# Patient Record
Sex: Female | Born: 1935 | Race: White | Hispanic: No | Smoking: Former smoker
Health system: Southern US, Community
[De-identification: ages and names within clinical notes are randomized; demographics above are authoritative.]

## PROBLEM LIST (undated history)

## (undated) DIAGNOSIS — I519 Heart disease, unspecified: Secondary | ICD-10-CM

## (undated) DIAGNOSIS — T7840XA Allergy, unspecified, initial encounter: Secondary | ICD-10-CM

## (undated) DIAGNOSIS — I219 Acute myocardial infarction, unspecified: Secondary | ICD-10-CM

## (undated) DIAGNOSIS — I4891 Unspecified atrial fibrillation: Secondary | ICD-10-CM

## (undated) DIAGNOSIS — I639 Cerebral infarction, unspecified: Secondary | ICD-10-CM

## (undated) DIAGNOSIS — Z955 Presence of coronary angioplasty implant and graft: Secondary | ICD-10-CM

## (undated) DIAGNOSIS — I10 Essential (primary) hypertension: Secondary | ICD-10-CM

## (undated) DIAGNOSIS — Z8619 Personal history of other infectious and parasitic diseases: Secondary | ICD-10-CM

## (undated) HISTORY — DX: Allergy, unspecified, initial encounter: T78.40XA

## (undated) HISTORY — DX: Essential (primary) hypertension: I10

## (undated) HISTORY — DX: Personal history of other infectious and parasitic diseases: Z86.19

## (undated) HISTORY — DX: Cerebral infarction, unspecified: I63.9

## (undated) HISTORY — DX: Heart disease, unspecified: I51.9

---

## 2012-12-25 ENCOUNTER — Ambulatory Visit (INDEPENDENT_AMBULATORY_CARE_PROVIDER_SITE_OTHER): Payer: Medicare Other | Admitting: Internal Medicine

## 2012-12-25 ENCOUNTER — Encounter: Payer: Self-pay | Admitting: Internal Medicine

## 2012-12-25 VITALS — BP 130/80 | HR 66 | Temp 97.6°F | Ht 64.0 in | Wt 119.0 lb

## 2012-12-25 DIAGNOSIS — I25810 Atherosclerosis of coronary artery bypass graft(s) without angina pectoris: Secondary | ICD-10-CM | POA: Insufficient documentation

## 2012-12-25 DIAGNOSIS — D490 Neoplasm of unspecified behavior of digestive system: Secondary | ICD-10-CM

## 2012-12-25 DIAGNOSIS — I10 Essential (primary) hypertension: Secondary | ICD-10-CM

## 2012-12-25 DIAGNOSIS — K219 Gastro-esophageal reflux disease without esophagitis: Secondary | ICD-10-CM

## 2012-12-25 NOTE — Progress Notes (Signed)
HPI  Pt presents to the clinic today to establish care. She is transferring care from [facility] in [place]. She has no concerns today.  Flu: 2013 Tetanus: unknown Pneumovax: unknown Eye doctor: yearly Dentist: yearly Colonoscopy: 2009  Past Medical History  Diagnosis Date  . History of chicken pox   . Allergy   . Heart disease   . Hypertension   . Stroke     Current Outpatient Prescriptions  Medication Sig Dispense Refill  . aspirin 325 MG EC tablet Take 325 mg by mouth daily.      . clopidogrel (PLAVIX) 75 MG tablet Take 75 mg by mouth daily.      Marland Kitchen gabapentin (NEURONTIN) 100 MG capsule Take 100 mg by mouth 2 (two) times daily.      . lansoprazole (PREVACID) 30 MG capsule Take 30 mg by mouth daily.      . metoprolol (LOPRESSOR) 100 MG tablet Take 100 mg by mouth daily.      . nitroGLYCERIN (NITROSTAT) 0.4 MG SL tablet Place 0.4 mg under the tongue every 5 (five) minutes as needed for chest pain.       No current facility-administered medications for this visit.    Allergies  Allergen Reactions  . Penicillins   . Sulfa Antibiotics     Family History  Problem Relation Age of Onset  . Heart disease Other     Parent, Grandparent  . Hypertension Other     Parent, Grandparent    History   Social History  . Marital Status: Widowed    Spouse Name: N/A    Number of Children: N/A  . Years of Education: 12   Occupational History  . Retired    Social History Main Topics  . Smoking status: Former Games developer  . Smokeless tobacco: Never Used  . Alcohol Use: No  . Drug Use: No  . Sexually Active: Not on file   Other Topics Concern  . Not on file   Social History Narrative   Regular exercise-yes   Caffeine Use-yes    ROS:  Constitutional: Denies fever, malaise, fatigue, headache or abrupt weight changes.  HEENT: Denies eye pain, eye redness, ear pain, ringing in the ears, wax buildup, runny nose, nasal congestion, bloody nose, or sore throat. Respiratory:  Denies difficulty breathing, shortness of breath, cough or sputum production.   Cardiovascular: Denies chest pain, chest tightness, palpitations or swelling in the hands or feet.  Gastrointestinal: Denies abdominal pain, bloating, constipation, diarrhea or blood in the stool.  GU: Denies frequency, urgency, pain with urination, blood in urine, odor or discharge. Musculoskeletal: Denies decrease in range of motion, difficulty with gait, muscle pain or joint pain and swelling.  Skin: Denies redness, rashes, lesions or ulcercations.  Neurological: Denies dizziness, difficulty with memory, difficulty with speech or problems with balance and coordination.   No other specific complaints in a complete review of systems (except as listed in HPI above).  PE:  BP 130/80  Pulse 66  Temp(Src) 97.6 F (36.4 C) (Oral)  Ht 5\' 4"  (1.626 m)  Wt 119 lb (53.978 kg)  BMI 20.42 kg/m2  SpO2 96% Wt Readings from Last 3 Encounters:  12/25/12 119 lb (53.978 kg)    General: Appears her stated age, well developed, well nourished in NAD. HEENT: Head: normal shape and size; Eyes: sclera white, no icterus, conjunctiva pink, PERRLA and EOMs intact; Ears: Tm's gray and intact, normal light reflex; Nose: mucosa pink and moist, septum midline; Throat/Mouth: Teeth present, mucosa  pink and moist, no lesions or ulcerations noted.  Neck: Normal range of motion. Neck supple, trachea midline. No massses, lumps or thyromegaly present.  Cardiovascular: Normal rate and rhythm. S1,S2 noted.  No murmur, rubs or gallops noted. No JVD or BLE edema. No carotid bruits noted. Pulmonary/Chest: Normal effort and positive vesicular breath sounds. No respiratory distress. No wheezes, rales or ronchi noted.  Abdomen: Soft and nontender. Normal bowel sounds, no bruits noted. No distention or masses noted. Liver, spleen and kidneys non palpable. Musculoskeletal: Normal range of motion. No signs of joint swelling. No difficulty with gait.   Neurological: Alert and oriented. Cranial nerves II-XII intact. Coordination normal. +DTRs bilaterally. Psychiatric: Mood and affect normal. Behavior is normal. Judgment and thought content normal.      Assessment and Plan:  Preventative Health Maintenance:  Will get records from [facility] to see if HM is utd

## 2012-12-25 NOTE — Patient Instructions (Signed)

## 2012-12-25 NOTE — Assessment & Plan Note (Signed)
Well controlled. Continue current meds

## 2012-12-25 NOTE — Assessment & Plan Note (Signed)
Getting MRI done tommorow Continue to follow with [facility]

## 2012-12-25 NOTE — Assessment & Plan Note (Signed)
Well controlled Continue to follow with cardiology Continue current meds

## 2012-12-26 ENCOUNTER — Encounter: Payer: Self-pay | Admitting: Internal Medicine

## 2013-01-19 ENCOUNTER — Emergency Department (HOSPITAL_COMMUNITY)
Admission: EM | Admit: 2013-01-19 | Discharge: 2013-01-19 | Disposition: A | Discharge status: Nursing Home (Includes ICF) | Payer: Medicare Other | Source: Home / Self Care | Attending: Emergency Medicine | Admitting: Emergency Medicine

## 2013-01-19 ENCOUNTER — Observation Stay (HOSPITAL_COMMUNITY)
Admission: EM | Admit: 2013-01-19 | Discharge: 2013-01-21 | Disposition: A | Discharge status: Home / Self Care | Payer: Medicare Other | Attending: Internal Medicine | Admitting: Internal Medicine

## 2013-01-19 ENCOUNTER — Encounter (HOSPITAL_COMMUNITY): Payer: Self-pay | Admitting: *Deleted

## 2013-01-19 DIAGNOSIS — E876 Hypokalemia: Principal | ICD-10-CM | POA: Insufficient documentation

## 2013-01-19 DIAGNOSIS — K5289 Other specified noninfective gastroenteritis and colitis: Secondary | ICD-10-CM

## 2013-01-19 DIAGNOSIS — I69920 Aphasia following unspecified cerebrovascular disease: Secondary | ICD-10-CM | POA: Insufficient documentation

## 2013-01-19 DIAGNOSIS — I252 Old myocardial infarction: Secondary | ICD-10-CM

## 2013-01-19 DIAGNOSIS — I10 Essential (primary) hypertension: Secondary | ICD-10-CM | POA: Insufficient documentation

## 2013-01-19 DIAGNOSIS — I25810 Atherosclerosis of coronary artery bypass graft(s) without angina pectoris: Secondary | ICD-10-CM | POA: Insufficient documentation

## 2013-01-19 DIAGNOSIS — R1084 Generalized abdominal pain: Secondary | ICD-10-CM | POA: Insufficient documentation

## 2013-01-19 DIAGNOSIS — Z9861 Coronary angioplasty status: Secondary | ICD-10-CM | POA: Insufficient documentation

## 2013-01-19 DIAGNOSIS — Z7902 Long term (current) use of antithrombotics/antiplatelets: Secondary | ICD-10-CM | POA: Insufficient documentation

## 2013-01-19 DIAGNOSIS — I69959 Hemiplegia and hemiparesis following unspecified cerebrovascular disease affecting unspecified side: Secondary | ICD-10-CM | POA: Insufficient documentation

## 2013-01-19 DIAGNOSIS — Z7982 Long term (current) use of aspirin: Secondary | ICD-10-CM | POA: Insufficient documentation

## 2013-01-19 DIAGNOSIS — I4891 Unspecified atrial fibrillation: Secondary | ICD-10-CM

## 2013-01-19 DIAGNOSIS — R197 Diarrhea, unspecified: Secondary | ICD-10-CM | POA: Diagnosis present

## 2013-01-19 DIAGNOSIS — K219 Gastro-esophageal reflux disease without esophagitis: Secondary | ICD-10-CM | POA: Insufficient documentation

## 2013-01-19 DIAGNOSIS — K529 Noninfective gastroenteritis and colitis, unspecified: Secondary | ICD-10-CM

## 2013-01-19 HISTORY — DX: Unspecified atrial fibrillation: I48.91

## 2013-01-19 HISTORY — DX: Presence of coronary angioplasty implant and graft: Z95.5

## 2013-01-19 HISTORY — DX: Acute myocardial infarction, unspecified: I21.9

## 2013-01-19 LAB — CBC WITH DIFFERENTIAL/PLATELET
Basophils Absolute: 0 10*3/uL (ref 0.0–0.1)
Basophils Absolute: 0.1 10*3/uL (ref 0.0–0.1)
Basophils Relative: 2 % — ABNORMAL HIGH (ref 0–1)
Eosinophils Absolute: 0.6 10*3/uL (ref 0.0–0.7)
HCT: 35.2 % — ABNORMAL LOW (ref 36.0–46.0)
Hemoglobin: 11.8 g/dL — ABNORMAL LOW (ref 12.0–15.0)
Lymphocytes Relative: 19 % (ref 12–46)
Lymphs Abs: 1.3 10*3/uL (ref 0.7–4.0)
MCH: 29.6 pg (ref 26.0–34.0)
MCHC: 34 g/dL (ref 30.0–36.0)
Monocytes Absolute: 0.8 10*3/uL (ref 0.1–1.0)
Monocytes Relative: 13 % — ABNORMAL HIGH (ref 3–12)
Monocytes Relative: 11 % (ref 3–12)
Neutro Abs: 3.8 10*3/uL (ref 1.7–7.7)
Neutro Abs: 3.9 10*3/uL (ref 1.7–7.7)
Neutrophils Relative %: 55 % (ref 43–77)
Platelets: 231 10*3/uL (ref 150–400)
RBC: 4.02 MIL/uL (ref 3.87–5.11)
RDW: 13.4 % (ref 11.5–15.5)
RDW: 13.3 % (ref 11.5–15.5)
WBC: 6.6 10*3/uL (ref 4.0–10.5)

## 2013-01-19 LAB — COMPREHENSIVE METABOLIC PANEL
ALT: 10 U/L (ref 0–35)
AST: 15 U/L (ref 0–37)
Alkaline Phosphatase: 111 U/L (ref 39–117)
BUN: 15 mg/dL (ref 6–23)
CO2: 26 mEq/L (ref 19–32)
CO2: 26 mEq/L (ref 19–32)
Chloride: 100 mEq/L (ref 96–112)
Creatinine, Ser: 0.84 mg/dL (ref 0.50–1.10)
GFR calc Af Amer: 83 mL/min — ABNORMAL LOW (ref >90)
GFR calc non Af Amer: 59 mL/min — ABNORMAL LOW (ref >90)
GFR calc non Af Amer: 72 mL/min — ABNORMAL LOW (ref >90)
Glucose, Bld: 102 mg/dL — ABNORMAL HIGH (ref 70–99)
Potassium: 4.3 mEq/L (ref 3.5–5.1)
Sodium: 135 mEq/L (ref 135–145)
Total Bilirubin: 0.6 mg/dL (ref 0.3–1.2)
Total Bilirubin: 0.6 mg/dL (ref 0.3–1.2)

## 2013-01-19 LAB — BASIC METABOLIC PANEL
BUN: 15 mg/dL (ref 6–23)
Calcium: 8.9 mg/dL (ref 8.4–10.5)
Chloride: 101 mEq/L (ref 96–112)
Creatinine, Ser: 0.81 mg/dL (ref 0.50–1.10)
GFR calc Af Amer: 71 mL/min — ABNORMAL LOW (ref >90)

## 2013-01-19 LAB — POCT I-STAT, CHEM 8
Chloride: 99 mEq/L (ref 96–112)
HCT: 41 % (ref 36.0–46.0)
Potassium: 3 mEq/L — ABNORMAL LOW (ref 3.5–5.1)
Sodium: 140 mEq/L (ref 135–145)

## 2013-01-19 LAB — URINALYSIS, ROUTINE W REFLEX MICROSCOPIC
Bilirubin Urine: NEGATIVE
Protein, ur: NEGATIVE mg/dL
Urobilinogen, UA: 0.2 mg/dL (ref 0.0–1.0)

## 2013-01-19 LAB — URINE MICROSCOPIC-ADD ON

## 2013-01-19 LAB — OCCULT BLOOD, POC DEVICE: Fecal Occult Bld: NEGATIVE

## 2013-01-19 LAB — MAGNESIUM: Magnesium: 1.3 mg/dL — ABNORMAL LOW (ref 1.5–2.5)

## 2013-01-19 MED ORDER — POTASSIUM CHLORIDE 10 MEQ/100ML IV SOLN
10.0000 meq | INTRAVENOUS | Status: AC
  Administered 2013-01-19 – 2013-01-20 (×2): 10 meq via INTRAVENOUS
  Filled 2013-01-19 (×2): qty 100

## 2013-01-19 MED ORDER — SODIUM CHLORIDE 0.9 % IV SOLN
Freq: Once | INTRAVENOUS | Status: AC
  Administered 2013-01-19: 11:00:00 via INTRAVENOUS

## 2013-01-19 MED ORDER — NITROGLYCERIN 0.4 MG SL SUBL: 0.4000 mg | SUBLINGUAL_TABLET | SUBLINGUAL | Status: DC | PRN

## 2013-01-19 MED ORDER — GABAPENTIN 100 MG PO CAPS
100.0000 mg | ORAL_CAPSULE | Freq: Every day | ORAL | Status: DC
  Administered 2013-01-19 – 2013-01-20 (×2): 100 mg via ORAL
  Filled 2013-01-19 (×3): qty 1

## 2013-01-19 MED ORDER — SODIUM CHLORIDE 0.9 % IV SOLN
INTRAVENOUS | Status: DC
  Administered 2013-01-19: 21:00:00 via INTRAVENOUS

## 2013-01-19 MED ORDER — ASPIRIN EC 325 MG PO TBEC: 325.0000 mg | DELAYED_RELEASE_TABLET | Freq: Every day | ORAL | Status: DC

## 2013-01-19 MED ORDER — GABAPENTIN 100 MG PO CAPS
100.0000 mg | ORAL_CAPSULE | Freq: Two times a day (BID) | ORAL | Status: DC
  Filled 2013-01-19: qty 1

## 2013-01-19 MED ORDER — TRAMADOL HCL 50 MG PO TABS
100.0000 mg | ORAL_TABLET | Freq: Two times a day (BID) | ORAL | Status: DC | PRN
  Filled 2013-01-19: qty 2

## 2013-01-19 MED ORDER — ASPIRIN EC 325 MG PO TBEC
325.0000 mg | DELAYED_RELEASE_TABLET | Freq: Every day | ORAL | Status: DC
  Administered 2013-01-20 – 2013-01-21 (×2): 325 mg via ORAL
  Filled 2013-01-19 (×2): qty 1

## 2013-01-19 MED ORDER — CLOPIDOGREL BISULFATE 75 MG PO TABS
75.0000 mg | ORAL_TABLET | Freq: Every day | ORAL | Status: DC
  Administered 2013-01-20 – 2013-01-21 (×2): 75 mg via ORAL
  Filled 2013-01-19: qty 1

## 2013-01-19 MED ORDER — PANTOPRAZOLE SODIUM 40 MG PO TBEC
40.0000 mg | DELAYED_RELEASE_TABLET | Freq: Every day | ORAL | Status: DC
  Administered 2013-01-20 – 2013-01-21 (×2): 40 mg via ORAL
  Filled 2013-01-19: qty 1

## 2013-01-19 MED ORDER — MAGNESIUM SULFATE 40 MG/ML IJ SOLN
2.0000 g | Freq: Once | INTRAMUSCULAR | Status: AC
  Administered 2013-01-19: 2 g via INTRAVENOUS
  Filled 2013-01-19: qty 50

## 2013-01-19 MED ORDER — POTASSIUM CHLORIDE 10 MEQ/100ML IV SOLN
10.0000 meq | Freq: Once | INTRAVENOUS | Status: AC
  Administered 2013-01-19: 10 meq via INTRAVENOUS
  Filled 2013-01-19 (×2): qty 100

## 2013-01-19 MED ORDER — POTASSIUM CHLORIDE 10 MEQ/100ML IV SOLN
10.0000 meq | Freq: Once | INTRAVENOUS | Status: AC
  Administered 2013-01-19: 10 meq via INTRAVENOUS

## 2013-01-19 MED ORDER — METOPROLOL TARTRATE 100 MG PO TABS
100.0000 mg | ORAL_TABLET | Freq: Every day | ORAL | Status: DC
  Administered 2013-01-20 – 2013-01-21 (×2): 100 mg via ORAL
  Filled 2013-01-19 (×2): qty 1

## 2013-01-19 MED ORDER — PANTOPRAZOLE SODIUM 20 MG PO TBEC: 20.0000 mg | DELAYED_RELEASE_TABLET | Freq: Every day | ORAL | Status: DC

## 2013-01-19 MED ORDER — SODIUM CHLORIDE 0.9 % IV SOLN: Freq: Once | INTRAVENOUS | Status: DC

## 2013-01-19 MED ORDER — POTASSIUM CHLORIDE CRYS ER 20 MEQ PO TBCR
40.0000 meq | EXTENDED_RELEASE_TABLET | Freq: Once | ORAL | Status: AC
  Administered 2013-01-19: 40 meq via ORAL
  Filled 2013-01-19: qty 2

## 2013-01-19 NOTE — ED Notes (Signed)
Patient resting on stretcher no complains at present.  Son at bedside.

## 2013-01-19 NOTE — H&P (Addendum)
[facility] History and Physical  Lori Travis ZOX:[id number] DOB: 1919-04-25 DOA: 01/19/2013  Referring physician:  PCP: Nicki Reaper, NP  Specialists:   Chief Complaint: 2 weeks of watery diarrhea, weakness  HPI: Lori Travis is a 79 y.o. WF PMHx  MI w/ Stent placement, CVA in April 2014, waxing and waning bouts of acute diarrhea (lasting approximately 2 weeks) with no inciting event. Negative abdominal pain plus increased flatulence initially was seen by [facility] who have begun workup however patient had a CVA and workup deceased. NOTE some residual left hemiparesis, expressive aphasia. During her initial workup period, [facility], had CT scan with follow up MRI due to a finding suspicious for possible tumor.  The son was later informed that it was scar tissue (results of MRI scan in hard chart). Patient describes diarrhea as runny having for or more bouts per day, negative blood, negative follow odor, negative change in color. States has not traveled has not changed eating habits, negative change in medication, has not gone camping states only 1 episode of nausea which preceded the diarrhea.  Patient said that change today was increasing weakness and when son called Cor[facility]  Was instructed to proceed to urgent care Center. At urgent care center patient was found to be significantly hypokalemic and was subsequently sent to the ED for further eval.  Pt denies palpitations. EKG shows non specific lateral ST depression, no other ischemic changes. Speaking to son, she just established with [facility]. Will refer to [facility] for follow up. Will send stool studies today. Will replace some K+ here as well. PT appears well, non tender abdomen on my exam.  2:04 PM  K+ came back very low, possibly ST depression on ECG is related. Given age and comorbities, will admit under observation for monitoring and IV and oral replacement of K+ TODAY patient resting comfortably in bed eating dinner,  denies any nausea vomiting, denies any abdominal pain, denies any back pain    Review of Systems: The patient denies , fever,, vision loss, decreased hearing, hoarseness, chest pain, syncope, dyspnea on exertion, peripheral edema, balance deficits, hemoptysis, abdominal pain, melena, hematochezia, severe indigestion/heartburn, hematuria, incontinence, genital sores, muscle weakness, suspicious skin lesions, transient blindness, difficulty walking, depression, unusual weight change, abnormal bleeding, enlarged lymph nodes, angioedema, and breast masses.    Past Medical History  Diagnosis Date  . History of chicken pox   . Allergy   . Heart disease   . Hypertension   . Stroke   . Atrial fibrillation   . MI (myocardial infarction)   . Stented coronary artery    History reviewed. No pertinent past surgical history. Social History:  reports that she has quit smoking. She has never used smokeless tobacco. She reports that she does not drink alcohol or use illicit drugs.   where does patient live--home; with son   Allergies  Allergen Reactions  . Penicillins   . Sulfa Antibiotics     Family History  Problem Relation Age of Onset  . Heart disease Other     Parent, Grandparent  . Hypertension Other     Parent, Grandparent  . Cancer Mother   . Stroke Neg Hx      Prior to Admission medications   Medication Sig Start Date End Date Taking? Authorizing Provider  aspirin 325 MG EC tablet Take 325 mg by mouth daily.   Yes Historical Provider, MD  clopidogrel (PLAVIX) 75 MG tablet Take 75 mg by mouth daily.   Yes  Historical Provider, MD  gabapentin (NEURONTIN) 100 MG capsule Take 100 mg by mouth 2 (two) times daily.   Yes Historical Provider, MD  lansoprazole (PREVACID) 30 MG capsule Take 30 mg by mouth daily.   Yes Historical Provider, MD  metoprolol (LOPRESSOR) 100 MG tablet Take 100 mg by mouth daily.   Yes Historical Provider, MD  nitroGLYCERIN (NITROSTAT) 0.4 MG SL tablet Place  0.4 mg under the tongue every 5 (five) minutes as needed for chest pain.   Yes Historical Provider, MD   Physical Exam: Filed Vitals:   01/19/13 1135 01/19/13 1451  BP: 152/75 143/82  Pulse: 72 78  Temp: 98.6 F (37 C) 98 F (36.7 C)  TempSrc: Oral Oral  Resp: 18 20  SpO2: 94% 100%     General: Alert,NAD  Eyes: Pupils equal round react to light and accommodation  Cardiovascular: Regular rhythm and rate, negative murmurs rubs or gallops, PT/DP 2+ bilaterally  Respiratory: Clear to auscultation bilaterally  Abdomen: Soft nontender nondistended plus bowel sounds  Neurologic: Alert and oriented x4, cranial nerves II through XII intact, mild droop of left cheek, sensation and muscle strength intact throughout  Labs on Admission:  Basic Metabolic Panel:  Recent Labs Lab 01/19/13 1037 01/19/13 1155  NA 140 138  K 3.0* 2.5*  CL 99 100  CO2  --  26  GLUCOSE 84 81  BUN 16 16  CREATININE 1.00 0.84  CALCIUM  --  9.1   Liver Function Tests:  Recent Labs Lab 01/19/13 1155  AST 15  ALT 10  ALKPHOS 108  BILITOT 0.6  PROT 6.3  ALBUMIN 3.3*   No results found for this basename: LIPASE, AMYLASE,  in the last 168 hours No results found for this basename: AMMONIA,  in the last 168 hours CBC:  Recent Labs Lab 01/19/13 1037 01/19/13 1155  WBC  --  6.6  NEUTROABS  --  3.8  HGB 13.9 11.8*  HCT 41.0 35.2*  MCV  --  87.6  PLT  --  227   Cardiac Enzymes: No results found for this basename: CKTOTAL, CKMB, CKMBINDEX, TROPONINI,  in the last 168 hours  BNP (last 3 results) No results found for this basename: PROBNP,  in the last 8760 hours CBG: No results found for this basename: GLUCAP,  in the last 168 hours  Radiological Exams on Admission: No results found.  EKG: No previous EKG for comparison, A. fib, right bundle branch block, low-voltage criteria for LVH, nonspecific ST-T wave changes in 1, aVL V5 V6 cannot rule out lateral ischemia    Assessment/Plan Principal Problem:   Hypokalemia Active Problems:   HTN (hypertension)   CAD (coronary artery disease) of artery bypass graft   GERD (gastroesophageal reflux disease)   Diarrhea   1. Hypokalemia; will aggressively hydrate patient tonight, patient has received of potassium. We'll redraw a potassium, at 2000 2. HTN; slightly elevated but given patient's age would not lower 3. CAD; patient has nonspecific abnormalities in her EKG given her electrolyte imbalance lack of symptoms at this time would only obtain serial troponins EKG in the a.m. after electrolytes replenished 4.  diarrhea; although does not meet the definition of chronic we'll workup diarrhea is chronic. If patient is to be discharged in a.m. and labs have not  returned will contact [facility] for followup    Family Communication: Son present in room, discussed plan of care Disposition Plan:   Time spent: 50 minute  WOODS, CURTIS, J [facility]  Pager [phone]  If 7PM-7AM, please contact night-coverage [email or web address] Password [email or web address] 01/19/2013, 5:14 PM

## 2013-01-19 NOTE — ED Notes (Signed)
[facility] notified of transport - truck unavailable.  [facility] notified of ALS transport.

## 2013-01-19 NOTE — ED Provider Notes (Signed)
Chief Complaint:   Chief Complaint  Patient presents with  . Diarrhea    History of Present Illness:   Lori Travis is a-year-old female with a history of atrial fibrillation, coronary artery disease, history of myocardial infarction, stents, and a CVA who presents with a one-week history of diarrhea with up to 15 loose stools per day. These are small in volume and associated with a large amount of gas. She's had some crampy abdominal pain and nausea but no vomiting. She denies any fever or chills. She's felt dizzy and lightheaded. No foreign travel or recent antibiotic exposure.  Review of Systems:  Other than noted above, the patient denies any of the following symptoms: Systemic:  No fevers, chills, sweats, weight loss or gain, fatigue, or tiredness. ENT:  No nasal congestion, rhinorrhea, or sore throat. Lungs:  No cough, wheezing, or shortness of breath. Cardiac:  No chest pain, syncope, or presyncope. GI:  No abdominal pain, nausea, vomiting, anorexia, diarrhea, constipation, blood in stool or vomitus. GU:  No dysuria, frequency, or urgency.  PMFSH:  Past medical history, family history, social history, meds, and allergies were reviewed.  Her chart shows allergies to penicillin sulfa, but she states she is intolerant to many medications including almost all antibiotics. Current meds include aspirin, Plavix, Neurontin, Prevacid, Lopressor, Nitrostat. She has a history of heart disease, hypertension, stroke, and osteoporosis.  Physical Exam:   Vital signs:  BP 135/81  Pulse 75  Temp(Src) 97 F (36.1 C) (Oral)  Resp 12  SpO2 96% General:  Alert and oriented.  In no distress.  Skin warm and dry.  Good skin turgor, brisk capillary refill. ENT:  No scleral icterus, moist mucous membranes, no oral lesions, pharynx clear. Lungs:  Breath sounds clear and equal bilaterally.  No wheezes, rales, or rhonchi. Heart:  Rhythm regular, without extrasystoles.  No gallops or murmers. Abdomen:  Soft,  flat, nondistended. She has mild generalized tenderness to palpation without any localizing tenderness to palpation, guarding, or rebound. Bowel sounds are normally active. No organomegaly or mass. Skin: Clear, warm, and dry.  Good turgor.  Brisk capillary refill.  Labs:   Results for orders placed during the hospital encounter of 01/19/13  POCT I-STAT, CHEM 8      Result Value Range   Sodium 140  135 - 145 mEq/L   Potassium 3.0 (*) 3.5 - 5.1 mEq/L   Chloride 99  96 - 112 mEq/L   BUN 16  6 - 23 mg/dL   Creatinine, Ser 0.86  0.50 - 1.10 mg/dL   Glucose, Bld 84  70 - 99 mg/dL   Calcium, Ion 5.78  4.69 - 1.30 mmol/L   TCO2 27  0 - 100 mmol/L   Hemoglobin 13.9  12.0 - 15.0 g/dL   HCT 62.9  52.8 - 41.3 %    EKG Results:  Date: 01/19/2013  Rate: 68  Rhythm: atrial fibrillation  QRS Axis: normal  Intervals: normal  ST/T Wave abnormalities: normal  Conduction Disutrbances:right bundle branch block  Narrative Interpretation: Normal sinus rhythm, right bundle branch block, LVH, possibly old inferior infarct.  Old EKG Reviewed: none available  Course in Urgent Care Center:   She was begun on IV normal saline, oxygen at 2 L per minute, and monitored.  Assessment:  The primary encounter diagnosis was Gastroenteritis. Diagnoses of Hypokalemia and Atrial fibrillation were also pertinent to this visit.  She has gastroenteritis, viral versus bacterial versus inflammatory. She will need replacement of her potassium and monitor.  Plan:   1.  The following meds were prescribed:   New Prescriptions   No medications on file   2.  The patient was transported to the emergency department via [facility].    Reuben Likes, MD 01/19/13 [phone]

## 2013-01-19 NOTE — ED Notes (Signed)
Per pt & son: pt has had diarrhea x 1 wk with gas and intermittent pains across stomach.  States she can have up to "15" episodes per day, but "very small volumes".  Denies bloody or dark tarry stools.  Had nausea 2 days ago that resolved; has had some dizziness that has resolved.  Has been taking 1-2 doses, sometimes 3, of Imodium on most days.  Has been drinking plenty of fluids.  Does not feel she can give stool sample at this time.

## 2013-01-19 NOTE — ED Provider Notes (Signed)
History    CSN: [id number] Arrival date & time 01/19/13  1131  First MD Initiated Contact with Patient 01/19/13 1131     Chief Complaint  Patient presents with  . Diarrhea   (Consider location/radiation/quality/duration/timing/severity/associated sxs/prior Treatment) The history is provided by the patient and a relative. No language interpreter was used.    Lori Travis is a 79 y.o. female  with a hx of CVA, MI x2 presents to the Emergency Department complaining of gradual, persistent, watery diarrhea with associated mild, generalized abdominal pain beginning 2 weeks ago.  Patient is been seen by [facility] with Dr Lanae Boast.  Record review shows that she had a CT scan on 12/17/12 for evaluation of her persistent diarrhea. Several abnormalities noted including questionable mass the ampulla and kidney. Son reports that patient had followup MRI for these abnormalities and their contents be simply scar tissue. It he reports MRI was normal. MRI results are unavailable in our system. Nothing seems to make the abdominal pain worse patient reports that abdominal pain subsides after defecation.  Pt denies fever, chills, headache, neck pain, chest pain, shortness of breath, nausea, vomiting, constipation, weakness, dizziness, syncope, dysuria, hematuria, melena, hematochezia.     Past Medical History  Diagnosis Date  . History of chicken pox   . Allergy   . Heart disease   . Hypertension   . Stroke   . Atrial fibrillation   . MI (myocardial infarction)   . Stented coronary artery    History reviewed. No pertinent past surgical history. Family History  Problem Relation Age of Onset  . Heart disease Other     Parent, Grandparent  . Hypertension Other     Parent, Grandparent  . Cancer Mother   . Stroke Neg Hx    History  Substance Use Topics  . Smoking status: Former Games developer  . Smokeless tobacco: Never Used  . Alcohol Use: No   OB History   Grav Para Term Preterm Abortions TAB SAB  Ect Mult Living                 Review of Systems  Constitutional: Negative for fever, diaphoresis, appetite change, fatigue and unexpected weight change.  HENT: Negative for mouth sores and neck stiffness.   Eyes: Negative for visual disturbance.  Respiratory: Negative for cough, chest tightness, shortness of breath and wheezing.   Cardiovascular: Negative for chest pain.  Gastrointestinal: Positive for abdominal pain and diarrhea. Negative for nausea, vomiting and constipation.  Endocrine: Negative for polydipsia, polyphagia and polyuria.  Genitourinary: Negative for dysuria, urgency, frequency and hematuria.  Musculoskeletal: Negative for back pain.  Skin: Negative for rash.  Allergic/Immunologic: Negative for immunocompromised state.  Neurological: Negative for syncope, light-headedness and headaches.  Hematological: Does not bruise/bleed easily.  Psychiatric/Behavioral: Negative for sleep disturbance. The patient is not nervous/anxious.     Allergies  Penicillins and Sulfa antibiotics  Home Medications   Current Outpatient Rx  Name  Route  Sig  Dispense  Refill  . aspirin 325 MG EC tablet   Oral   Take 325 mg by mouth daily.         . clopidogrel (PLAVIX) 75 MG tablet   Oral   Take 75 mg by mouth daily.         Marland Kitchen gabapentin (NEURONTIN) 100 MG capsule   Oral   Take 100 mg by mouth 2 (two) times daily.         . lansoprazole (PREVACID) 30 MG capsule  Oral   Take 30 mg by mouth daily.         . metoprolol (LOPRESSOR) 100 MG tablet   Oral   Take 100 mg by mouth daily.         . nitroGLYCERIN (NITROSTAT) 0.4 MG SL tablet   Sublingual   Place 0.4 mg under the tongue every 5 (five) minutes as needed for chest pain.          BP 152/75  Pulse 72  Temp(Src) 98.6 F (37 C) (Oral)  Resp 18  SpO2 94% Physical Exam  Nursing note and vitals reviewed. Constitutional: She is oriented to person, place, and time. She appears well-developed and  well-nourished. No distress.  HENT:  Head: Normocephalic and atraumatic.  Mouth/Throat: Oropharynx is clear and moist. No oropharyngeal exudate.  Eyes: Conjunctivae are normal. Pupils are equal, round, and reactive to light. No scleral icterus.  Neck: Normal range of motion. Neck supple.  Cardiovascular: Normal rate and intact distal pulses.  An irregularly irregular rhythm present.  Pulses:      Radial pulses are 2+ on the right side, and 2+ on the left side.       Dorsalis pedis pulses are 2+ on the right side, and 2+ on the left side.       Posterior tibial pulses are 2+ on the right side, and 2+ on the left side.  Capillary refill less than 3  Pulmonary/Chest: Effort normal and breath sounds normal. No accessory muscle usage. Not tachypneic. No respiratory distress. She has no decreased breath sounds. She has no wheezes. She has no rhonchi. She has no rales.  Clear and equal breath sounds without rales or rhonchi  Abdominal: Soft. Normal appearance and bowel sounds are normal. She exhibits no mass. There is generalized tenderness (very mild). There is no rigidity, no rebound, no guarding, no CVA tenderness, no tenderness at McBurney's point and negative Murphy's sign.  Abdomen soft and generally sore to palpation without focal pain  Genitourinary: Rectal exam shows no external hemorrhoid, no internal hemorrhoid, no fissure, no mass, no tenderness and anal tone normal.  DRE: normal rectal tone; soft brown stool in the rectal vault without evidence of impaction and not grossly bloody  Musculoskeletal: Normal range of motion. She exhibits no edema.  Lymphadenopathy:    She has no cervical adenopathy.  Neurological: She is alert and oriented to person, place, and time. She exhibits normal muscle tone. Coordination normal.  Speech is clear and goal oriented Moves extremities without ataxia  Skin: Skin is warm and dry. No rash noted. She is not diaphoretic. No erythema.  Psychiatric: She has a  normal mood and affect.    ED Course  Procedures (including critical care time) Labs Reviewed  CBC WITH DIFFERENTIAL - Abnormal; Notable for the following:    Hemoglobin 11.8 (*)    HCT 35.2 (*)    Monocytes Relative 13 (*)    Eosinophils Relative 10 (*)    All other components within normal limits  COMPREHENSIVE METABOLIC PANEL - Abnormal; Notable for the following:    Potassium 2.5 (*)    Albumin 3.3 (*)    GFR calc non Af Amer 59 (*)    GFR calc Af Amer 68 (*)    All other components within normal limits  STOOL CULTURE  PROTIME-INR  APTT  URINALYSIS, ROUTINE W REFLEX MICROSCOPIC  GI PATHOGEN PANEL BY PCR, STOOL  OCCULT BLOOD, POC DEVICE   ECG:  Date: 01/19/2013  Rate: 78  Rhythm: atrial fibrillation  QRS Axis: right  Intervals: n/a  ST/T Wave abnormalities: nonspecific ST/T changes with borderline depression in the lateral leads  Conduction Disutrbances:right bundle branch block  Narrative Interpretation: borderline ST depression in lateral leads without old for comparison  Old EKG Reviewed: none available    No results found. 1. Hypokalemia   2. HTN (hypertension)   3. CAD (coronary artery disease) of artery bypass graft     MDM  Lyndel Safe presents for diarrhea, hypokalemia and mild, generalized, diffuse abd pain.  Pt with recent CT and MRI of the abd which was normal.  Pt sees Dr Ripley Fraise at [facility].  Will check labs, replete K and give fluid bolus.    2:03 PM Pt remains alert and oriented, nonseptic, nontoxic appearing.  CMP with K+ of 2.5.  Considering patient's hypokalemic status and question of EKG changes we'll admit for potassium repletion the IV and further monitoring.  Plan for a [facility] followup on discharge.  Dr. Quita Skye was consulted, evaluated this patient with me and agrees with the plan.           Dahlia Client Latrish Mogel, PA-C 01/19/13 [phone]

## 2013-01-19 NOTE — ED Provider Notes (Signed)
Medical screening examination/treatment/procedure(s) were conducted as a shared visit with non-physician practitioner(s) and myself.  I personally evaluated the patient during the encounter  Pt with borderline chronic diarrhea with minimal associated abd pain, has been seen by PCP.  She was seen by [facility], had CT scan with follow up MRI due to a finding suspicious for possible tumor and then later told son that it was scar tissue.  I reviewed CT scan results, but MRI results not available.  Non bloody diarrhea has persisted for several weeks.  No CP, SOB. ABd here is soft, no guard or rebound.  No N/V.  Plan to check labs, pt denies dizziness, weakness.  Pt has been on imodium and seen at urgent care today, sent to the ED for further eval.  K+ was mildly low.  Pt denies palpitations.  EKG shows non specific lateral ST depression, no other ischemic changes.  Speaking to son, she just established with [place] PCP.  Will refer to [facility] for follow up.  Will send stool studies today.  Will replace some K+ here as well.  PT appears well, non tender abdomen on my exam.    2:04 PM K+ came back very low, possibly ST depression on ECG is related.  Given age and comorbities, will admit under observation for monitoring and IV and oral replacement of K+  Gavin Pound. Carroll Ranney, MD 01/19/13 1610

## 2013-01-19 NOTE — ED Notes (Signed)
Report called to [phone]

## 2013-01-19 NOTE — Progress Notes (Signed)
Notified MD of Magnesium of 1.3 and Potassium 3.4. New orders received. Will continue to monitor. Earnest Conroy RN

## 2013-01-19 NOTE — ED Notes (Signed)
Report called to Lequita Halt, ED Charge RN.

## 2013-01-19 NOTE — ED Notes (Signed)
Patient states she has had diarrhea for 2 weeks c/o abd. Pain with nausea and some dizziness several days ago none now. Patient is alert oriented . States she lived alone until 3 weeks ago when she had a stroke and now lives with her son. Denies pain at present.

## 2013-01-19 NOTE — ED Notes (Signed)
Patient was transferred from [facility] with c/o diarrhea x 2 weeks , using OTC meds without relief

## 2013-01-19 NOTE — ED Notes (Addendum)
Pt placed on cardiac monitor, lead II rate appx. 68, EKG performed; showing LVH, RBB, and atrial fib rhythm. Pt also placed on O2 by [place] @ 2L, spO2 100% with supplemental O2.

## 2013-01-20 DIAGNOSIS — I252 Old myocardial infarction: Secondary | ICD-10-CM

## 2013-01-20 LAB — TSH: TSH: 2.678 u[IU]/mL (ref 0.350–4.500)

## 2013-01-20 LAB — COMPREHENSIVE METABOLIC PANEL
ALT: 8 U/L (ref 0–35)
AST: 16 U/L (ref 0–37)
Albumin: 3.5 g/dL (ref 3.5–5.2)
Alkaline Phosphatase: 116 U/L (ref 39–117)
Alkaline Phosphatase: 102 U/L (ref 39–117)
BUN: 13 mg/dL (ref 6–23)
BUN: 12 mg/dL (ref 6–23)
CO2: 26 mEq/L (ref 19–32)
Calcium: 8.4 mg/dL (ref 8.4–10.5)
Chloride: 100 mEq/L (ref 96–112)
GFR calc Af Amer: 82 mL/min — ABNORMAL LOW (ref >90)
GFR calc non Af Amer: 71 mL/min — ABNORMAL LOW (ref >90)
Glucose, Bld: 100 mg/dL — ABNORMAL HIGH (ref 70–99)
Potassium: 3.6 mEq/L (ref 3.5–5.1)
Potassium: 3.7 mEq/L (ref 3.5–5.1)
Sodium: 136 mEq/L (ref 135–145)
Total Protein: 6.4 g/dL (ref 6.0–8.3)

## 2013-01-20 LAB — CLOSTRIDIUM DIFFICILE BY PCR: Toxigenic C. Difficile by PCR: NEGATIVE

## 2013-01-20 LAB — MAGNESIUM
Magnesium: 1.7 mg/dL (ref 1.5–2.5)
Magnesium: 1.8 mg/dL (ref 1.5–2.5)

## 2013-01-20 MED ORDER — MAGNESIUM OXIDE 400 (241.3 MG) MG PO TABS
400.0000 mg | ORAL_TABLET | Freq: Once | ORAL | Status: AC
  Administered 2013-01-20: 400 mg via ORAL
  Filled 2013-01-20: qty 1

## 2013-01-20 MED ORDER — POTASSIUM CHLORIDE CRYS ER 20 MEQ PO TBCR
30.0000 meq | EXTENDED_RELEASE_TABLET | Freq: Once | ORAL | Status: AC
  Administered 2013-01-20: 30 meq via ORAL
  Filled 2013-01-20: qty 1

## 2013-01-20 NOTE — Progress Notes (Addendum)
[facility] Progress Note [facility] PROGRESS NOTE  Lori Travis ZOX:[id number] DOB: [birth year] DOA: 01/19/2013 PCP: Nicki Reaper, NP  Assessment/Plan:  1. Hypokalemia; continue hydration tonight, have started patient on mag oxide 400 mg, K-Dur 30 mg.  patient is a previous MI patient goal of K.> 4mg /dL, and Mg> 2 mg/dL. 2. HTN; slightly elevated but given patient's age would not lower 3. CAD; changes in patient's EKG rectified 4.  diarrhea; C. difficile negative however all of patient's stool cultures are still pending, therefore patient to remain on contact precautions. All other labs still pending although does not meet the definition of chronic we'll workup diarrhea is chronic. Still pending multiple stool labs when labs returned if unable to determine cause of diarrhea will consult [facility]. NOTE: Patient has a vague history of pancreatic neoplasm. Alkaline phosphatase, lipase, ALT, AST, ALT within normal limits at this time. Will discuss with son exactly when this occurred, and what type of cancer it was. Patient is unsure which consider abdominal ultrasound, abdominal CT     Code Status: ? Family Communication:   Disposition Plan: ?   Consultants:    Procedures: C difficile  NEGATIVE EKG 01/20/2013; when compared to EKG from 01/19/2013 patient is still in A. fib, the inverted T waves in V5 and V6 have him normalize,RBBB is present.   HPI/Subjective: Lori Travis is a 79 y.o. WF PMHx  MI w/ Stent placement, CVA in April 2014, waxing and waning bouts of acute diarrhea (lasting approximately 2 weeks) with no inciting event. Negative abdominal pain plus increased flatulence initially was seen by [facility] who have begun workup however patient had a CVA and workup deceased. NOTE some residual left hemiparesis, expressive aphasia. During her initial workup period, [facility], had CT scan with follow up MRI due to a finding suspicious for possible tumor.  The  son was later informed that it was scar tissue (results of MRI scan in hard chart). Patient describes diarrhea as runny having for or more bouts per day, negative blood, negative follow odor, negative change in color. States has not traveled has not changed eating habits, negative change in medication, has not gone camping states only 1 episode of nausea which preceded the diarrhea.  Patient said that change today was increasing weakness and when son called Cor[facility]  Was instructed to proceed to urgent care Center. At urgent care center patient was found to be significantly hypokalemic and was subsequently sent to the ED for further eval.  Pt denies palpitations. EKG shows non specific lateral ST depression, no other ischemic changes. Speaking to son, she just established with [place] PCP. Will refer to [facility] for follow up. Will send stool studies today. Will replace some K+ here as well. PT appears well, non tender abdomen on my exam.  2:04 PM  K+ came back very low, possibly ST depression on ECG is related. Given age and comorbities, will admit under observation for monitoring and IV and oral replacement of K+ TODAY states continues to have watery diarrhea (3-4 episodes), states other than the diarrhea patient feels fine. Disappointed could not be discharged today. Denies any nausea vomiting, denies any abdominal pain, denies any back pain    Objective: Filed Vitals:   01/19/13 1730 01/19/13 1731 01/19/13 1916 01/20/13 0510  BP: 151/96 151/88 164/94 150/98  Pulse: 73  83 41  Temp: 97.5 F (36.4 C)  97.6 F (36.4 C) 98.2 F (36.8 C)  TempSrc: Oral  Oral Oral  Resp: 16  18 16  Height:  5\' 4"  (1.626 m)    Weight:  52.617 kg (116 lb)  52.527 kg (115 lb 12.8 oz)  SpO2: 99%  97% 96%    Intake/Output Summary (Last 24 hours) at 01/20/13 0832 Last data filed at 01/19/13 1914  Gross per 24 hour  Intake    150 ml  Output      0 ml  Net    150 ml   Filed Weights   01/19/13 1731 01/20/13 0510   Weight: 52.617 kg (116 lb) 52.527 kg (115 lb 12.8 oz)    Exam:   General:  Alert,NAD, eating lunch without any signs or symptoms of nausea or vomiting  Cardiovascular: Regular rhythm and rate, negative murmurs rubs or gallops, DP/PT pulse 2+  Respiratory: Clear to auscultation bilateral  Abdomen: Soft, nontender, nondistended, plus bowel sounds  Data Reviewed: Basic Metabolic Panel:  Recent Labs Lab 01/19/13 1037 01/19/13 1155 01/19/13 1803 01/19/13 1928  NA 140 138 135 136  K 3.0* 2.5* 4.3 3.4*  CL 99 100 101 101  CO2  --  26 26 28   GLUCOSE 84 81 102* 86  BUN 16 16 15 15   CREATININE 1.00 0.84 0.74 0.81  CALCIUM  --  9.1 9.1 8.9  MG  --   --  1.3* 1.3*   Liver Function Tests:  Recent Labs Lab 01/19/13 1155 01/19/13 1803  AST 15 15  ALT 10 10  ALKPHOS 108 111  BILITOT 0.6 0.6  PROT 6.3 6.4  ALBUMIN 3.3* 3.4*    Recent Labs Lab 01/19/13 1928  LIPASE 47   No results found for this basename: AMMONIA,  in the last 168 hours CBC:  Recent Labs Lab 01/19/13 1037 01/19/13 1155 01/19/13 1803  WBC  --  6.6 7.2  NEUTROABS  --  3.8 3.9  HGB 13.9 11.8* 12.8  HCT 41.0 35.2* 37.7  MCV  --  87.6 87.3  PLT  --  227 231   Cardiac Enzymes: No results found for this basename: CKTOTAL, CKMB, CKMBINDEX, TROPONINI,  in the last 168 hours BNP (last 3 results) No results found for this basename: PROBNP,  in the last 8760 hours CBG: No results found for this basename: GLUCAP,  in the last 168 hours  No results found for this or any previous visit (from the past 240 hour(s)).   Studies: No results found.  Scheduled Meds: . sodium chloride   Intravenous Once  . aspirin EC  325 mg Oral Daily  . clopidogrel  75 mg Oral Q breakfast  . gabapentin  100 mg Oral QHS  . magnesium oxide  400 mg Oral Once  . metoprolol  100 mg Oral Daily  . pantoprazole  40 mg Oral Daily  . potassium chloride  30 mEq Oral Once   Continuous Infusions: . sodium chloride 75 mL/hr at  01/19/13 2053    Principal Problem:   Hypokalemia Active Problems:   HTN (hypertension)   CAD (coronary artery disease) of artery bypass graft   GERD (gastroesophageal reflux disease)   Diarrhea    Time spent:30 min    Reinhart Saulters, J  [facility] Pager [phone]. If 7PM-7AM, please contact night-coverage at [email or web address], password [facility] 01/20/2013, 8:32 AM  LOS: 1 day

## 2013-01-21 ENCOUNTER — Telehealth: Payer: Self-pay | Admitting: Internal Medicine

## 2013-01-21 DIAGNOSIS — I4891 Unspecified atrial fibrillation: Secondary | ICD-10-CM | POA: Diagnosis present

## 2013-01-21 LAB — URINE CULTURE: Colony Count: >=100000

## 2013-01-21 LAB — MAGNESIUM: Magnesium: 1.6 mg/dL (ref 1.5–2.5)

## 2013-01-21 LAB — COMPREHENSIVE METABOLIC PANEL
AST: 14 U/L (ref 0–37)
Albumin: 2.9 g/dL — ABNORMAL LOW (ref 3.5–5.2)
BUN: 12 mg/dL (ref 6–23)
Calcium: 8.5 mg/dL (ref 8.4–10.5)
Creatinine, Ser: 0.7 mg/dL (ref 0.50–1.10)
Total Protein: 5.5 g/dL — ABNORMAL LOW (ref 6.0–8.3)

## 2013-01-21 LAB — GI PATHOGEN PANEL BY PCR, STOOL
C difficile toxin A/B: NEGATIVE
E coli (ETEC) LT/ST: NEGATIVE
E coli (STEC): NEGATIVE
E coli 0157 by PCR: NEGATIVE
Salmonella by PCR: NEGATIVE

## 2013-01-21 MED ORDER — TRAMADOL HCL 50 MG PO TABS: 100.0000 mg | ORAL_TABLET | Freq: Two times a day (BID) | ORAL | Status: DC | PRN

## 2013-01-21 MED ORDER — PANTOPRAZOLE SODIUM 40 MG PO TBEC: 40.0000 mg | DELAYED_RELEASE_TABLET | Freq: Every day | ORAL | Status: DC

## 2013-01-21 MED ORDER — POTASSIUM CHLORIDE CRYS ER 20 MEQ PO TBCR
30.0000 meq | EXTENDED_RELEASE_TABLET | Freq: Two times a day (BID) | ORAL | Status: DC
  Administered 2013-01-21: 30 meq via ORAL
  Filled 2013-01-21 (×2): qty 1

## 2013-01-21 MED ORDER — MAGNESIUM OXIDE 400 (241.3 MG) MG PO TABS: 400.0000 mg | ORAL_TABLET | Freq: Two times a day (BID) | ORAL | Status: DC

## 2013-01-21 MED ORDER — GABAPENTIN 100 MG PO CAPS: 100.0000 mg | ORAL_CAPSULE | Freq: Two times a day (BID) | ORAL | Status: DC

## 2013-01-21 MED ORDER — POTASSIUM CHLORIDE CRYS ER 15 MEQ PO TBCR: 30.0000 meq | EXTENDED_RELEASE_TABLET | Freq: Two times a day (BID) | ORAL | Status: DC

## 2013-01-21 MED ORDER — MAGNESIUM OXIDE 400 (241.3 MG) MG PO TABS
400.0000 mg | ORAL_TABLET | Freq: Two times a day (BID) | ORAL | Status: DC
  Administered 2013-01-21: 400 mg via ORAL
  Filled 2013-01-21 (×2): qty 1

## 2013-01-21 NOTE — Progress Notes (Signed)
Reviewed discharge instructions with patient's son, he stated his understanding.  Clarified with MD patient to continue her prilosec and not to start protonix; reviewed this with son and he stated his understanding.  Also spoke with son about having outpatient pharmacy to give them 10 meq potassium for ease of patient to swallow.  Patient discharged home with son via wheelchair by volunteers.  Lori Travis

## 2013-01-21 NOTE — Discharge Summary (Signed)
Physician Discharge Summary  Lori Travis ONG:[id number] DOB: 18-Jul-1919 DOA: 01/19/2013  PCP: Nicki Reaper, NP  Admit date: 01/19/2013 Discharge date: 01/21/2013  Time spent: 30 minutes   Recommendations for Outpatient Follow-up:  1. HTN; above AHA guidelines but given patient's age would not lower it further. Will discharge on current home regimen 2. Diarrhea (chronic); although the patient's length of time before having diarrhea it is not chronic but a guidelines, begun with chronic diarrhea workup secondary to patient having multiple several week long episode labs are still not complete. Refer for outpatient workup to [facility] 3. Hypokalemia; resolved we'll send patient home on K-Dur (goal K.> 8mEq/L) secondary to [facility]. Guidelines. 4. Hypomagnesemia; resolved we'll send patient home on Mag-Ox (goal Mg> 2mg /dL) secondary to AHA guidelines 5. MI; stable  Discharge Diagnoses:  Principal Problem:   Hypokalemia Active Problems:   HTN (hypertension)   CAD (coronary artery disease) of artery bypass graft   GERD (gastroesophageal reflux disease)   Diarrhea   Myocardial infarct, old   Hypomagnesemia   Discharge Condition: Stable  Diet recommendation: Heart healthy  Filed Weights   01/19/13 1731 01/20/13 0510 01/21/13 0517  Weight: 52.617 kg (116 lb) 52.527 kg (115 lb 12.8 oz) 52.8 kg (116 lb 6.5 oz)    History of present illness:  Lori Travis is a 79 y.o. WF PMHx MI w/ Stent placement, CVA in April 2014, waxing and waning bouts of acute diarrhea (lasting approximately 2 weeks) with no inciting event. Negative abdominal pain plus increased flatulence initially [facility] who have begun workup however patient had a CVA and workup deceased. NOTE some residual left hemiparesis, expressive aphasia. During her initial workup period, [facility], had CT scan with follow up MRI due to a finding suspicious for possible tumor. The son was later informed that it was scar  tissue (results of MRI scan in hard chart). Patient describes diarrhea as runny having for or more bouts per day, negative blood, negative follow odor, negative change in color. States has not traveled has not changed eating habits, negative change in medication, has not gone camping states only 1 episode of nausea which preceded the diarrhea. Patient said that change today was increasing weakness and when son called [facility] Was instructed to proceed to urgent care Center. At urgent care center patient was found to be significantly hypokalemic and was subsequently sent to the ED for further eval. Pt denies palpitations. EKG shows non specific lateral ST depression, no other ischemic changes. Speaking to son, she just established with [place] PCP. Will refer to [facility] for follow up. Will send stool studies today. Will replace some K+ here as well. PT appears well, non tender abdomen on my exam.  2:04 PM  K+ came back very low, possibly ST depression on ECG is related. Given age and comorbities, will admit under observation for monitoring and IV and oral replacement of K+ TODAY states only one episode of  watery diarrhea overnight, states has her appetite back negative nausea vomiting. States ready to be discharged    Hospital Course:  Admitted on 01/19/2013 secondary to mitigate hypokalemia with EKG changes, dehydration secondary to diarrhea. Patient also discovered to have hypomagnesemia. Have repleted patient's potassium, magnesium, and fluids. Awaiting chronic diarrhea stool labs. Patient was ruled out for C. difficile will discharge patient for outpatient followup with [facility], since patient also sees the [facility] cardiology for her cardiac issues   Discharge Exam: Filed Vitals:   01/20/13 4401  01/20/13 1400 01/20/13 1953 01/21/13 0517  BP:  144/85 141/75 138/81  Pulse: 84 88 84 71  Temp:  97.6 F (36.4 C) 97.8 F (36.6 C) 98.1 F (36.7 C)  TempSrc:   Oral Oral  Resp:  17 18 18   Height:       Weight:    52.8 kg (116 lb 6.5 oz)  SpO2:  97% 97% 95%    General: Alert,NAD Cardiovascular: Irregular rhythm, regular rate no murmurs rubs or gallops, DP/PT pulses 2+ Respiratory: Clear to auscultation bilaterally Abdomen; soft, nontender, nondistended, plus bowel sounds   Discharge Instructions     Medication List    ASK your doctor about these medications       aspirin 325 MG EC tablet  Take 325 mg by mouth daily.     clopidogrel 75 MG tablet  Commonly known as:  PLAVIX  Take 75 mg by mouth daily.     gabapentin 100 MG capsule  Commonly known as:  NEURONTIN  Take 100 mg by mouth at bedtime.     lansoprazole 30 MG capsule  Commonly known as:  PREVACID  Take 30 mg by mouth daily.     metoprolol 100 MG tablet  Commonly known as:  LOPRESSOR  Take 100 mg by mouth daily.     nitroGLYCERIN 0.4 MG SL tablet  Commonly known as:  NITROSTAT  Place 0.4 mg under the tongue every 5 (five) minutes as needed for chest pain.       Allergies  Allergen Reactions  . Penicillins   . Sulfa Antibiotics       The results of significant diagnostics from this hospitalization (including imaging, microbiology, ancillary and laboratory) are listed below for reference.    Significant Diagnostic Studies: No results found.  Microbiology: Recent Results (from the past 240 hour(s))  URINE CULTURE     Status: None   Collection Time    01/19/13  2:09 PM      Result Value Range Status   Specimen Description URINE, RANDOM   Final   Special Requests NONE   Final   Culture  Setup Time 01/20/2013 02:57   Final   Colony Count >=100,000 COLONIES/ML   Final   Culture     Final   Value: Multiple bacterial morphotypes present, none predominant. Suggest appropriate recollection if clinically indicated.   Report Status 01/21/2013 FINAL   Final  CLOSTRIDIUM DIFFICILE BY PCR     Status: None   Collection Time    01/19/13  7:24 PM      Result Value Range Status   C difficile by pcr  NEGATIVE  NEGATIVE Final     Labs: Basic Metabolic Panel:  Recent Labs Lab 01/19/13 1803 01/19/13 1928 01/20/13 1230 01/20/13 1556 01/21/13 0514  NA 135 136 136 136 137  K 4.3 3.4* 3.6 3.7 3.2*  CL 101 101 100 101 102  CO2 26 28 26 26 23   GLUCOSE 102* 86 112* 100* 99  BUN 15 15 13 12 12   CREATININE 0.74 0.81 0.73 0.76 0.70  CALCIUM 9.1 8.9 8.9 8.4 8.5  MG 1.3* 1.3* 1.7 1.8 1.6   Liver Function Tests:  Recent Labs Lab 01/19/13 1155 01/19/13 1803 01/20/13 1230 01/20/13 1556 01/21/13 0514  AST 15 15 16 14 14   ALT 10 10 10 8 8   ALKPHOS 108 111 116 102 101  BILITOT 0.6 0.6 0.7 0.6 0.7  PROT 6.3 6.4 6.4 5.7* 5.5*  ALBUMIN 3.3* 3.4* 3.5 3.0* 2.9*  Recent Labs Lab 01/19/13 1928  LIPASE 47   No results found for this basename: AMMONIA,  in the last 168 hours CBC:  Recent Labs Lab 01/19/13 1037 01/19/13 1155 01/19/13 1803  WBC  --  6.6 7.2  NEUTROABS  --  3.8 3.9  HGB 13.9 11.8* 12.8  HCT 41.0 35.2* 37.7  MCV  --  87.6 87.3  PLT  --  227 231   Cardiac Enzymes: No results found for this basename: CKTOTAL, CKMB, CKMBINDEX, TROPONINI,  in the last 168 hours BNP: BNP (last 3 results) No results found for this basename: PROBNP,  in the last 8760 hours CBG: No results found for this basename: GLUCAP,  in the last 168 hours     Signed:  Carolyne Littles, J  [facility] 01/21/2013, 7:48 AM

## 2013-01-22 ENCOUNTER — Telehealth: Payer: Self-pay | Admitting: Internal Medicine

## 2013-01-22 ENCOUNTER — Emergency Department (HOSPITAL_COMMUNITY)
Admission: EM | Admit: 2013-01-22 | Discharge: 2013-01-22 | Disposition: A | Discharge status: Home / Self Care | Payer: Medicare Other | Attending: Emergency Medicine | Admitting: Emergency Medicine

## 2013-01-22 ENCOUNTER — Encounter (HOSPITAL_COMMUNITY): Payer: Self-pay | Admitting: Vascular Surgery

## 2013-01-22 DIAGNOSIS — I10 Essential (primary) hypertension: Secondary | ICD-10-CM | POA: Insufficient documentation

## 2013-01-22 DIAGNOSIS — Z87891 Personal history of nicotine dependence: Secondary | ICD-10-CM | POA: Insufficient documentation

## 2013-01-22 DIAGNOSIS — Z9861 Coronary angioplasty status: Secondary | ICD-10-CM | POA: Insufficient documentation

## 2013-01-22 DIAGNOSIS — I252 Old myocardial infarction: Secondary | ICD-10-CM | POA: Insufficient documentation

## 2013-01-22 DIAGNOSIS — R5383 Other fatigue: Secondary | ICD-10-CM | POA: Insufficient documentation

## 2013-01-22 DIAGNOSIS — Z7982 Long term (current) use of aspirin: Secondary | ICD-10-CM | POA: Insufficient documentation

## 2013-01-22 DIAGNOSIS — Z8619 Personal history of other infectious and parasitic diseases: Secondary | ICD-10-CM | POA: Insufficient documentation

## 2013-01-22 DIAGNOSIS — Z79899 Other long term (current) drug therapy: Secondary | ICD-10-CM | POA: Insufficient documentation

## 2013-01-22 DIAGNOSIS — R197 Diarrhea, unspecified: Secondary | ICD-10-CM

## 2013-01-22 DIAGNOSIS — Z8679 Personal history of other diseases of the circulatory system: Secondary | ICD-10-CM | POA: Insufficient documentation

## 2013-01-22 DIAGNOSIS — I519 Heart disease, unspecified: Secondary | ICD-10-CM | POA: Insufficient documentation

## 2013-01-22 DIAGNOSIS — R5381 Other malaise: Secondary | ICD-10-CM | POA: Insufficient documentation

## 2013-01-22 DIAGNOSIS — E876 Hypokalemia: Secondary | ICD-10-CM

## 2013-01-22 DIAGNOSIS — Z8673 Personal history of transient ischemic attack (TIA), and cerebral infarction without residual deficits: Secondary | ICD-10-CM | POA: Insufficient documentation

## 2013-01-22 LAB — BASIC METABOLIC PANEL
CO2: 24 mEq/L (ref 19–32)
Calcium: 9.1 mg/dL (ref 8.4–10.5)
Chloride: 98 mEq/L (ref 96–112)
Potassium: 3 mEq/L — ABNORMAL LOW (ref 3.5–5.1)
Sodium: 134 mEq/L — ABNORMAL LOW (ref 135–145)

## 2013-01-22 LAB — OVA AND PARASITE EXAMINATION

## 2013-01-22 LAB — BASIC METABOLIC PANEL WITH GFR
BUN: 15 mg/dL (ref 6–23)
Creatinine, Ser: 0.81 mg/dL (ref 0.50–1.10)
GFR calc Af Amer: 71 mL/min — ABNORMAL LOW (ref >90)
GFR calc non Af Amer: 61 mL/min — ABNORMAL LOW (ref >90)
Glucose, Bld: 116 mg/dL — ABNORMAL HIGH (ref 70–99)

## 2013-01-22 LAB — MAGNESIUM: Magnesium: 1.6 mg/dL (ref 1.5–2.5)

## 2013-01-22 MED ORDER — DIPHENOXYLATE-ATROPINE 2.5-0.025 MG PO TABS: 1.0000 | ORAL_TABLET | Freq: Four times a day (QID) | ORAL | Status: DC | PRN

## 2013-01-22 MED ORDER — ONDANSETRON HCL 4 MG/2ML IJ SOLN
4.0000 mg | Freq: Once | INTRAMUSCULAR | Status: AC
  Administered 2013-01-22: 4 mg via INTRAVENOUS
  Filled 2013-01-22: qty 2

## 2013-01-22 MED ORDER — POTASSIUM CHLORIDE CRYS ER 20 MEQ PO TBCR
40.0000 meq | EXTENDED_RELEASE_TABLET | Freq: Once | ORAL | Status: AC
  Administered 2013-01-22: 40 meq via ORAL
  Filled 2013-01-22: qty 2

## 2013-01-22 MED ORDER — POTASSIUM CHLORIDE 10 MEQ/100ML IV SOLN
10.0000 meq | Freq: Once | INTRAVENOUS | Status: AC
  Administered 2013-01-22: 10 meq via INTRAVENOUS
  Filled 2013-01-22: qty 100

## 2013-01-22 MED ORDER — SODIUM CHLORIDE 0.9 % IV BOLUS (SEPSIS)
1000.0000 mL | Freq: Once | INTRAVENOUS | Status: AC
  Administered 2013-01-22: 1000 mL via INTRAVENOUS

## 2013-01-22 MED ORDER — ONDANSETRON 8 MG PO TBDP: 8.0000 mg | ORAL_TABLET | Freq: Two times a day (BID) | ORAL | Status: DC | PRN

## 2013-01-22 NOTE — Telephone Encounter (Signed)
I will not be able to accept. 

## 2013-01-22 NOTE — Discharge Instructions (Signed)
Chronic Diarrhea  Diarrhea is loose, watery stools. Having diarrhea means passing loose stools 3 or more times a day. Diarrhea that lasts longer than 4 weeks is considered long-lasting (chronic). Symptoms of chronic diarrhea may be continual or may come and go. People of all ages can get diarrhea. Body fluid loss (dehydration) may occur as a result of diarrhea. This means the body does not have as many fluids and salts (electrolytes) as it needs.  CAUSES   There are many causes of chronic diarrhea. Causes may be different for children and adults. The various causes can be grouped into 2 categories: diarrhea caused by an infection and diarrhea not caused by an infection. Sometimes, the cause is unknown.  Diarrhea caused by an infection may result from:  · Parasites.  · Bacteria.  · Viral infections.  Diarrhea not caused by an infection may result from:  · Irritable bowel syndrome.  · Reaction to medicines, such as antibiotics, cancer drugs, blood pressure medicines, and antacids.  · Intestinal disease (Crohn's disease, ulcerative colitis, celiac disease).  · Food allergies or sensitivity to additives (fructose, lactose, sugar substitutes).  · Tumors.  · Diabetes, thyroid disease, and other endocrine diseases.  · Reduced blood flow to the intestine.  · Previous surgery or radiation of the abdomen or gastrointestinal tract.  Risk factors for chronic diarrhea include:  · Having a severely weakened immune system, such as from HIV/AIDS.  · Taking certain types of cancer-fighting drugs (chemotherapy) or other medicines.  · A recent organ transplant.  · Having a portion of the stomach removed.  · Traveling to countries where food and water supplies are often contaminated.  SYMPTOMS   In addition to frequent, loose stools, diarrhea may cause:  · Cramping.  · Abdominal pain.  · Nausea.  · Urgent need to use the bathroom, or loss of bowel control.  If dehydration occurs, problems include:  · Thirst.  · Less frequent  urination.  · Dark urine.  · Dry skin.  · Fatigue.  · Dizziness.  Infections that cause diarrhea may also cause a fever, chills, or bloody stools.  DIAGNOSIS   Diagnosis may be difficult. Your caregiver must take a careful history and perform a physical exam. Tests given are based on your symptoms and history. Tests may include:  · Blood or stool tests, in which 3 or more stool samples may be examined. Stool cultures may be used to test for bacteria or parasites.  · X-rays.  · A procedure in which a thin tube is inserted into the mouth or rectum (endoscopy). This allows the caregiver to look inside the intestine.  TREATMENT   · Diarrhea caused by an infection can often be treated with antibiotics.  · Diarrhea not caused by an infection is more difficult to diagnose and treat. Long-term medicine use or surgery may be required. Specific treatment should be discussed with your caregiver.  · If the cause cannot determined, treatment to relieve symptoms includes:  · Preventing dehydration. Serious health problems can occur if you do not maintain proper fluid levels. Many oral rehydration solutions (ORS) are available at drug stores. Ask your caregiver what product is best for you.  · Not drinking beverages that contain caffeine (tea, coffee, soft drinks).  · Not drinking alcohol. It causes dehydration.  · Not relying on sports drinks and broths alone to maintain proper fluid levels. They should not be used to prevent severe dehydration.  · Maintaining well-balanced nutrition. This may help you recover   faster.  PREVENTION   · Drink clean or purified water.  · Use proper food handling techniques.  · Maintain proper hand-washing habits.  HOME CARE INSTRUCTIONS   · Avoid:  · Caffeine.  · Greasy foods.  · High fiber.  · If you have problems digesting lactose during or after an episode of diarrhea, you might want to try yogurt. Yogurt is often better tolerated, because it has less lactose than milk. Yogurt with active, live  bacterial cultures may even help you recover faster.  SEEK MEDICAL CARE IF:   The person with diarrhea is an otherwise healthy adult and has:  · Signs of dehydration.  · Diarrhea for more than 2 days.  · Severe pain in the abdomen or rectum.  · An oral temperature above 102° F (38.9° C).  · Stools containing blood or pus.  · Stools that are black and tarry.  SEEK IMMEDIATE MEDICAL CARE IF:   The person with diarrhea is a child, elderly person, or has a weakened immune system and has:  · Signs of dehydration.  · Diarrhea for more than 1 day.  · Severe pain in the abdomen or rectum.  · An oral temperature above 102° F (38.9° C), not controlled by medicine.  · Stools containing blood or pus.  · Stools that are black and tarry.  Document Released: 09/24/2003 Document Revised: 09/26/2011 Document Reviewed: 11/20/2009  [employer]® Patient Information ©2014 [employer].

## 2013-01-22 NOTE — Telephone Encounter (Signed)
Pt's daughter in law informed of NP's advisement.

## 2013-01-22 NOTE — Telephone Encounter (Signed)
Pt's son also informed of NP's advisement.

## 2013-01-22 NOTE — Telephone Encounter (Signed)
Pt's son Annette Stable) called stated that pt has an appt with [facility] 01/23/13 for hospital fu but pt's symptoms is getting worse. Annette Stable stated that ever since pt came home from the hospital pt can not eat anything, still having nausea and diarrhea. Pt's son is not sure what else to do and wants advise from pcp. Please call Bill back.

## 2013-01-22 NOTE — ED Provider Notes (Addendum)
History    CSN: [id number] Arrival date & time 01/22/13  1424  First MD Initiated Contact with Patient 01/22/13 1451     Chief Complaint  Patient presents with  . Nausea  . Emesis  . Diarrhea   (Consider location/radiation/quality/duration/timing/severity/associated sxs/prior Treatment) HPI Comments: Level 5 caveat due to dementia.  Pt had had about 2-3 weeks of diarrhea, was jus admitted to the hospital for 2 days due to low K+ and low Mg.  Pt was released yesterday after labs improved, diarrhea had lessened.  After getting home last night, had emesis right after eating.  Didn't keep meds down.  Pt has had 4 more loose stools today and continued to have mild nausea today.  Pt and family called PMD and PA there recommended to she come to the ED for IVF's.  She denies current CP, SOB, N/V.    Patient is a 79 y.o. female presenting with vomiting and diarrhea. The history is provided by the patient, medical records and a relative.  Emesis Severity:  Mild Timing:  Sporadic Number of daily episodes:  1 Quality:  Undigested food and stomach contents How soon after eating does vomiting occur:  10 minutes Progression:  Partially resolved Chronicity:  New Context: not post-tussive and not self-induced   Associated symptoms: diarrhea   Associated symptoms: no abdominal pain   Diarrhea:    Quality:  Watery and semi-solid   Severity:  Moderate   Timing:  Intermittent   Progression:  Unchanged Diarrhea Associated symptoms: vomiting   Associated symptoms: no abdominal pain    Past Medical History  Diagnosis Date  . History of chicken pox   . Allergy   . Heart disease   . Hypertension   . Stroke   . Atrial fibrillation   . MI (myocardial infarction)   . Stented coronary artery    History reviewed. No pertinent past surgical history. Family History  Problem Relation Age of Onset  . Heart disease Other     Parent, Grandparent  . Hypertension Other     Parent, Grandparent  .  Cancer Mother   . Stroke Neg Hx    History  Substance Use Topics  . Smoking status: Former Games developer  . Smokeless tobacco: Never Used  . Alcohol Use: No   OB History   Grav Para Term Preterm Abortions TAB SAB Ect Mult Living                 Review of Systems  Constitutional: Positive for appetite change and fatigue.  Respiratory: Negative for chest tightness and shortness of breath.   Cardiovascular: Negative for chest pain.  Gastrointestinal: Positive for nausea, vomiting and diarrhea. Negative for abdominal pain.  Genitourinary: Negative for dysuria and flank pain.  Musculoskeletal: Negative for back pain.  Neurological: Positive for weakness. Negative for seizures, syncope and numbness.  All other systems reviewed and are negative.    Allergies  Penicillins and Sulfa antibiotics  Home Medications   Current Outpatient Rx  Name  Route  Sig  Dispense  Refill  . aspirin 325 MG EC tablet   Oral   Take 325 mg by mouth daily.         . clopidogrel (PLAVIX) 75 MG tablet   Oral   Take 75 mg by mouth daily.         Marland Kitchen gabapentin (NEURONTIN) 100 MG capsule   Oral   Take 100 mg by mouth at bedtime.         Marland Kitchen  lansoprazole (PREVACID) 30 MG capsule   Oral   Take 30 mg by mouth daily.         . magnesium oxide (MAG-OX) 400 (241.3 MG) MG tablet   Oral   Take 1 tablet (400 mg total) by mouth 2 (two) times daily.   120 tablet   3   . metoprolol (LOPRESSOR) 100 MG tablet   Oral   Take 100 mg by mouth daily.         . nitroGLYCERIN (NITROSTAT) 0.4 MG SL tablet   Sublingual   Place 0.4 mg under the tongue every 5 (five) minutes as needed for chest pain.         . traMADol (ULTRAM) 50 MG tablet   Oral   Take 100 mg by mouth every 12 (twelve) hours as needed for pain. Back pain         . diphenoxylate-atropine (LOMOTIL) 2.5-0.025 MG per tablet   Oral   Take 1 tablet by mouth 4 (four) times daily as needed for diarrhea or loose stools.   30 tablet   0   .  ondansetron (ZOFRAN-ODT) 8 MG disintegrating tablet   Oral   Take 1 tablet (8 mg total) by mouth every 12 (twelve) hours as needed for nausea.   20 tablet   0    BP 154/85  Pulse 97  Temp(Src) 97.6 F (36.4 C) (Oral)  Resp 20  SpO2 93% Physical Exam  Nursing note and vitals reviewed. Constitutional: She appears well-developed and well-nourished. No distress.  HENT:  Head: Normocephalic and atraumatic.  Eyes: EOM are normal. No scleral icterus.  Neck: Normal range of motion. Neck supple.  Cardiovascular: Normal rate and intact distal pulses.   No murmur heard. Pulmonary/Chest: Effort normal. No respiratory distress.  Abdominal: Soft. She exhibits no distension. There is no tenderness. There is no rebound and no guarding.  Neurological: She is alert. She exhibits normal muscle tone. Coordination normal.  Skin: Skin is warm and dry. She is not diaphoretic.  Psychiatric: She has a normal mood and affect.    ED Course  Procedures (including critical care time) Labs Reviewed  BASIC METABOLIC PANEL - Abnormal; Notable for the following:    Sodium 134 (*)    Potassium 3.0 (*)    Glucose, Bld 116 (*)    GFR calc non Af Amer 61 (*)    GFR calc Af Amer 71 (*)    All other components within normal limits  MAGNESIUM   No results found. 1. Diarrhea   2. Hypokalemia     RA sat is 96% and I interpret to be adequate  By cardiac monitor, telemetry, pt was in atrial fibrillation with controlled rate at 85.  7:07 PM Pt given oral and IV K+, pt remains stable with no CP, dizziness, no abd pain, no further vomiting or diarrhea here.  I reviewed labs and stools cultures all came back negative.    MDM  Pt's K+ is 3.0, magnesium is ok.  Pt only had vomiting once last night, in the setting of taking many of her meds as well as dinner.  May have been too much for her.  Today, eating small amount, bland food, pt has kept it down.  Here, pt is tolerating PO's after zofran.  Will give Rx for  zofran as well as lomotil to see if diarrhea improves at home.  Pt has follow up with PMD in 1-2 days and [facility] is reviewing her records.  Gavin Pound. Oletta Lamas, MD 01/22/13 1907  Gavin Pound. Averleigh Savary, MD 01/22/13 1910

## 2013-01-22 NOTE — ED Notes (Signed)
Pt reports to the ED for eval of continued N/V/D. Pt was admitted to the hospital and d/c yesterday for hx of the same and was supposed to follow up with a GI doctor and her PCP. Pt reports that she left around lunch yesterday and that around 1930 last pm she began vomiting again. She has had diarrhea x 1 week. Pt was admitted for electrolyte imbalance. Pt A&O x4. V/S stable. Pt denies any abdominal pain. Pt denies any coffee ground or gross blood in her emesis. Pt also denies any dark tarry or gross blood in her stool. Pt denies fevers or chills.

## 2013-01-22 NOTE — Telephone Encounter (Signed)
Records Reviewed by Dr Marina Goodell who feels she is receiving excellent care with Current GI Doctor.  I have given the Records to Dr Juanda Chance to review today she is the Doc of Day today 01-22-13 since it is our Policy to have two Physicians review records.

## 2013-01-22 NOTE — Telephone Encounter (Signed)
If no better, see if can work in with another MD today or advise to return to ED

## 2013-01-23 ENCOUNTER — Ambulatory Visit (INDEPENDENT_AMBULATORY_CARE_PROVIDER_SITE_OTHER): Payer: Medicare Other | Admitting: Internal Medicine

## 2013-01-23 ENCOUNTER — Encounter: Payer: Self-pay | Admitting: Internal Medicine

## 2013-01-23 VITALS — BP 128/62 | HR 93 | Temp 98.2°F | Ht 64.0 in | Wt 122.8 lb

## 2013-01-23 DIAGNOSIS — R197 Diarrhea, unspecified: Secondary | ICD-10-CM

## 2013-01-23 DIAGNOSIS — R110 Nausea: Secondary | ICD-10-CM

## 2013-01-23 DIAGNOSIS — E876 Hypokalemia: Secondary | ICD-10-CM

## 2013-01-23 DIAGNOSIS — R5381 Other malaise: Secondary | ICD-10-CM

## 2013-01-23 LAB — STOOL CULTURE

## 2013-01-23 NOTE — Patient Instructions (Signed)
Diarrhea Diarrhea is frequent loose and watery bowel movements. It can cause you to feel weak and dehydrated. Dehydration can cause you to become tired and thirsty, have a dry mouth, and have decreased urination that often is dark yellow. Diarrhea is a sign of another problem, most often an infection that will not last long. In most cases, diarrhea typically lasts 2 3 days. However, it can last longer if it is a sign of something more serious. It is important to treat your diarrhea as directed by your caregive to lessen or prevent future episodes of diarrhea. CAUSES  Some common causes include:  Gastrointestinal infections caused by viruses, bacteria, or parasites.  Food poisoning or food allergies.  Certain medicines, such as antibiotics, chemotherapy, and laxatives.  Artificial sweeteners and fructose.  Digestive disorders. HOME CARE INSTRUCTIONS  Ensure adequate fluid intake (hydration): have 1 cup (8 oz) of fluid for each diarrhea episode. Avoid fluids that contain simple sugars or sports drinks, fruit juices, whole milk products, and sodas. Your urine should be clear or pale yellow if you are drinking enough fluids. Hydrate with an oral rehydration solution that you can purchase at pharmacies, retail stores, and online. You can prepare an oral rehydration solution at home by mixing the following ingredients together:    tsp table salt.   tsp baking soda.   tsp salt substitute containing potassium chloride.  1  tablespoons sugar.  1 L (34 oz) of water.  Certain foods and beverages may increase the speed at which food moves through the gastrointestinal (GI) tract. These foods and beverages should be avoided and include:  Caffeinated and alcoholic beverages.  High-fiber foods, such as raw fruits and vegetables, nuts, seeds, and whole grain breads and cereals.  Foods and beverages sweetened with sugar alcohols, such as xylitol, sorbitol, and mannitol.  Some foods may be well  tolerated and may help thicken stool including:  Starchy foods, such as rice, toast, pasta, low-sugar cereal, oatmeal, grits, baked potatoes, crackers, and bagels.  Bananas.  Applesauce.  Add probiotic-rich foods to help increase healthy bacteria in the GI tract, such as yogurt and fermented milk products.  Wash your hands well after each diarrhea episode.  Only take over-the-counter or prescription medicines as directed by your caregiver.  Take a warm bath to relieve any burning or pain from frequent diarrhea episodes. SEEK IMMEDIATE MEDICAL CARE IF:   You are unable to keep fluids down.  You have persistent vomiting.  You have blood in your stool, or your stools are black and tarry.  You do not urinate in 6 8 hours, or there is only a small amount of very dark urine.  You have abdominal pain that increases or localizes.  You have weakness, dizziness, confusion, or lightheadedness.  You have a severe headache.  Your diarrhea gets worse or does not get better.  You have a fever or persistent symptoms for more than 2 3 days.  You have a fever and your symptoms suddenly get worse. MAKE SURE YOU:   Understand these instructions.  Will watch your condition.  Will get help right away if you are not doing well or get worse. Document Released: 06/24/2002 Document Revised: 06/20/2012 Document Reviewed: 03/11/2012 [employer] Patient Information 2014 [employer].  

## 2013-01-23 NOTE — Progress Notes (Signed)
Subjective:    Patient ID: Lori Travis, female    DOB: 1918/11/02, 79 y.o.   MRN: [id number]  HPI  Pt presents to the clinic today for hospital f/u. She went to the hospital on 01/19/2013 for weakness, dehydration, nausea and diarrhea. She was given IV rehydration, potassium replacement. Stools studies were obtained and were negative. Once she was able to hold down her meals and the diarrhea slowed she was discharged home on 01/21/2013. The next day, she continued to have nausea, vomiting, diarrhea and weakness. She could not be worked into the clinic that day so she returned to the ER. She was again given IV rehydration, potassium supplement and sent home with zofran with plans to follow up with her PCP. Her records are already being reviewed by [facility] and she is awaiting an appointment with them. She is feeling a little better since leaving the hospital. She has only had 1 episode of diarrhea today, no nausea or vomiting. She does feel fatigued though.  Review of Systems  Past Medical History  Diagnosis Date  . History of chicken pox   . Allergy   . Heart disease   . Hypertension   . Stroke   . Atrial fibrillation   . MI (myocardial infarction)   . Stented coronary artery     Current Outpatient Prescriptions  Medication Sig Dispense Refill  . aspirin 325 MG EC tablet Take 325 mg by mouth daily.      . clopidogrel (PLAVIX) 75 MG tablet Take 75 mg by mouth daily.      . diphenoxylate-atropine (LOMOTIL) 2.5-0.025 MG per tablet Take 1 tablet by mouth 4 (four) times daily as needed for diarrhea or loose stools.  30 tablet  0  . gabapentin (NEURONTIN) 100 MG capsule Take 100 mg by mouth at bedtime.      . lansoprazole (PREVACID) 30 MG capsule Take 30 mg by mouth daily.      . magnesium oxide (MAG-OX) 400 (241.3 MG) MG tablet Take 1 tablet (400 mg total) by mouth 2 (two) times daily.  120 tablet  3  . metoprolol (LOPRESSOR) 100 MG tablet Take 100 mg by mouth daily.      . nitroGLYCERIN  (NITROSTAT) 0.4 MG SL tablet Place 0.4 mg under the tongue every 5 (five) minutes as needed for chest pain.      Marland Kitchen ondansetron (ZOFRAN-ODT) 8 MG disintegrating tablet Take 1 tablet (8 mg total) by mouth every 12 (twelve) hours as needed for nausea.  20 tablet  0  . traMADol (ULTRAM) 50 MG tablet Take 100 mg by mouth every 12 (twelve) hours as needed for pain. Back pain       No current facility-administered medications for this visit.    Allergies  Allergen Reactions  . Penicillins     unknown  . Sulfa Antibiotics     unknown    Family History  Problem Relation Age of Onset  . Heart disease Other     Parent, Grandparent  . Hypertension Other     Parent, Grandparent  . Cancer Mother   . Stroke Neg Hx     History   Social History  . Marital Status: Widowed    Spouse Name: N/A    Number of Children: N/A  . Years of Education: 12   Occupational History  . Retired    Social History Main Topics  . Smoking status: Former Games developer  . Smokeless tobacco: Never Used  . Alcohol Use: No  .  Drug Use: No  . Sexually Active: Not on file   Other Topics Concern  . Not on file   Social History Narrative   Regular exercise-yes   Caffeine Use-yes     Constitutional: Pt reports fatigue. Denies fever, malaise, headache or abrupt weight changes.  Respiratory: Denies difficulty breathing, shortness of breath, cough or sputum production.   Cardiovascular: Denies chest pain, chest tightness, palpitations or swelling in the hands or feet.  Gastrointestinal: Pt reports diarrhea. Denies abdominal pain, bloating, constipation, or blood in the stool.  Neurological: Denies dizziness, difficulty with memory, difficulty with speech or problems with balance and coordination.   No other specific complaints in a complete review of systems (except as listed in HPI above).     Objective:   Physical Exam   BP 128/62  Pulse 93  Temp(Src) 98.2 F (36.8 C) (Oral)  Ht 5\' 4"  (1.626 m)  Wt 122  lb 12.8 oz (55.702 kg)  BMI 21.07 kg/m2  SpO2 94% Wt Readings from Last 3 Encounters:  01/23/13 122 lb 12.8 oz (55.702 kg)  01/21/13 116 lb 6.5 oz (52.8 kg)  12/25/12 119 lb (53.978 kg)    General: Appears her stated age, well developed, well nourished in NAD.  Cardiovascular: Normal rate and rhythm. S1,S2 noted.  No murmur, rubs or gallops noted. No JVD or BLE edema. No carotid bruits noted. Pulmonary/Chest: Normal effort and positive vesicular breath sounds. No respiratory distress. No wheezes, rales or ronchi noted.  Abdomen: Soft and nontender. Normal bowel sounds, no bruits noted. No distention or masses noted. Liver, spleen and kidneys non palpable.  Neurological: Alert and oriented. Cranial nerves II-XII intact. Coordination normal. +DTRs bilaterally.  BMET    Component Value Date/Time   NA 134* 01/22/2013 1515   K 3.0* 01/22/2013 1515   CL 98 01/22/2013 1515   CO2 24 01/22/2013 1515   GLUCOSE 116* 01/22/2013 1515   BUN 15 01/22/2013 1515   CREATININE 0.81 01/22/2013 1515   CALCIUM 9.1 01/22/2013 1515   GFRNONAA 61* 01/22/2013 1515   GFRAA 71* 01/22/2013 1515    Lipid Panel  No results found for this basename: chol, trig, hdl, cholhdl, vldl, ldlcalc    CBC    Component Value Date/Time   WBC 7.2 01/19/2013 1803   RBC 4.32 01/19/2013 1803   HGB 12.8 01/19/2013 1803   HCT 37.7 01/19/2013 1803   PLT 231 01/19/2013 1803   MCV 87.3 01/19/2013 1803   MCH 29.6 01/19/2013 1803   MCHC 34.0 01/19/2013 1803   RDW 13.3 01/19/2013 1803   LYMPHSABS 1.7 01/19/2013 1803   MONOABS 0.8 01/19/2013 1803   EOSABS 0.6 01/19/2013 1803   BASOSABS 0.1 01/19/2013 1803    Hgb A1C No results found for this basename: HGBA1C        Assessment & Plan:   Fatigue secondary to diarrhea of unknown origin:  Drink plenty of fluids Take your Zofran as needed Lomotil only 2 times per day as needed- don't overuse, I don't want you to get constipated Keep taking k Dur, will recheck potassium in 1 week Follow up with GI  RTC as  needed or if symptoms persist or worsen

## 2013-01-24 LAB — POTASSIUM, STOOL: Potassium, Stl: 30 mEq/L

## 2013-01-25 LAB — FECAL FAT, QUANTITATIVE

## 2013-01-25 LAB — PANCREATIC ELASTASE, FECAL: Pancreatic Elastase-1, Stool: <15 mcg/g — ABNORMAL LOW

## 2013-01-25 LAB — CHLORIDE, FECAL: Chloride, Stl: 70 mEq/L (ref 0–40)

## 2013-01-27 ENCOUNTER — Inpatient Hospital Stay (HOSPITAL_COMMUNITY): Payer: Medicare Other

## 2013-01-27 ENCOUNTER — Emergency Department (HOSPITAL_COMMUNITY): Payer: Medicare Other

## 2013-01-27 ENCOUNTER — Inpatient Hospital Stay (HOSPITAL_COMMUNITY)
Admission: EM | Admit: 2013-01-27 | Discharge: 2013-01-31 | DRG: 292 | Disposition: A | Discharge status: Skilled Nursing Facility | Payer: Medicare Other | Attending: Internal Medicine | Admitting: Internal Medicine

## 2013-01-27 ENCOUNTER — Encounter (HOSPITAL_COMMUNITY): Payer: Self-pay | Admitting: Radiology

## 2013-01-27 DIAGNOSIS — Z87891 Personal history of nicotine dependence: Secondary | ICD-10-CM

## 2013-01-27 DIAGNOSIS — Z7902 Long term (current) use of antithrombotics/antiplatelets: Secondary | ICD-10-CM

## 2013-01-27 DIAGNOSIS — K219 Gastro-esophageal reflux disease without esophagitis: Secondary | ICD-10-CM

## 2013-01-27 DIAGNOSIS — I25810 Atherosclerosis of coronary artery bypass graft(s) without angina pectoris: Secondary | ICD-10-CM | POA: Diagnosis present

## 2013-01-27 DIAGNOSIS — I5031 Acute diastolic (congestive) heart failure: Principal | ICD-10-CM | POA: Diagnosis present

## 2013-01-27 DIAGNOSIS — I509 Heart failure, unspecified: Secondary | ICD-10-CM

## 2013-01-27 DIAGNOSIS — Z9861 Coronary angioplasty status: Secondary | ICD-10-CM

## 2013-01-27 DIAGNOSIS — R0902 Hypoxemia: Secondary | ICD-10-CM | POA: Diagnosis present

## 2013-01-27 DIAGNOSIS — Z79899 Other long term (current) drug therapy: Secondary | ICD-10-CM

## 2013-01-27 DIAGNOSIS — I252 Old myocardial infarction: Secondary | ICD-10-CM

## 2013-01-27 DIAGNOSIS — Z8673 Personal history of transient ischemic attack (TIA), and cerebral infarction without residual deficits: Secondary | ICD-10-CM

## 2013-01-27 DIAGNOSIS — I4891 Unspecified atrial fibrillation: Secondary | ICD-10-CM

## 2013-01-27 DIAGNOSIS — I10 Essential (primary) hypertension: Secondary | ICD-10-CM

## 2013-01-27 DIAGNOSIS — N390 Urinary tract infection, site not specified: Secondary | ICD-10-CM | POA: Diagnosis present

## 2013-01-27 DIAGNOSIS — E876 Hypokalemia: Secondary | ICD-10-CM | POA: Diagnosis present

## 2013-01-27 DIAGNOSIS — R197 Diarrhea, unspecified: Secondary | ICD-10-CM

## 2013-01-27 DIAGNOSIS — I2510 Atherosclerotic heart disease of native coronary artery without angina pectoris: Secondary | ICD-10-CM | POA: Diagnosis present

## 2013-01-27 DIAGNOSIS — A498 Other bacterial infections of unspecified site: Secondary | ICD-10-CM | POA: Diagnosis present

## 2013-01-27 LAB — COMPREHENSIVE METABOLIC PANEL
AST: 21 U/L (ref 0–37)
CO2: 24 mEq/L (ref 19–32)
Calcium: 9.7 mg/dL (ref 8.4–10.5)
Creatinine, Ser: 0.92 mg/dL (ref 0.50–1.10)
GFR calc Af Amer: 61 mL/min — ABNORMAL LOW (ref >90)
GFR calc non Af Amer: 52 mL/min — ABNORMAL LOW (ref >90)
Total Protein: 6.2 g/dL (ref 6.0–8.3)

## 2013-01-27 LAB — CREATININE, SERUM
Creatinine, Ser: 0.9 mg/dL (ref 0.50–1.10)
GFR calc Af Amer: 62 mL/min — ABNORMAL LOW (ref >90)
GFR calc non Af Amer: 54 mL/min — ABNORMAL LOW (ref >90)

## 2013-01-27 LAB — CBC
HCT: 35.4 % — ABNORMAL LOW (ref 36.0–46.0)
MCH: 29.5 pg (ref 26.0–34.0)
MCHC: 33.4 g/dL (ref 30.0–36.0)
MCHC: 33.3 g/dL (ref 30.0–36.0)
MCV: 88.2 fL (ref 78.0–100.0)
Platelets: 202 10*3/uL (ref 150–400)
Platelets: 240 10*3/uL (ref 150–400)
RBC: 4.34 MIL/uL (ref 3.87–5.11)
RDW: 13.9 % (ref 11.5–15.5)
WBC: 6.7 10*3/uL (ref 4.0–10.5)

## 2013-01-27 LAB — URINALYSIS, ROUTINE W REFLEX MICROSCOPIC
Hgb urine dipstick: NEGATIVE
Nitrite: NEGATIVE
Specific Gravity, Urine: 1.016 (ref 1.005–1.030)
Urobilinogen, UA: 0.2 mg/dL (ref 0.0–1.0)

## 2013-01-27 LAB — LIPASE, BLOOD: Lipase: 24 U/L (ref 11–59)

## 2013-01-27 LAB — MAGNESIUM: Magnesium: 1.6 mg/dL (ref 1.5–2.5)

## 2013-01-27 MED ORDER — IOHEXOL 350 MG/ML SOLN
100.0000 mL | Freq: Once | INTRAVENOUS | Status: AC | PRN
  Administered 2013-01-27: 100 mL via INTRAVENOUS

## 2013-01-27 MED ORDER — FUROSEMIDE 10 MG/ML IJ SOLN
20.0000 mg | Freq: Two times a day (BID) | INTRAMUSCULAR | Status: DC
  Administered 2013-01-27 – 2013-01-31 (×8): 20 mg via INTRAVENOUS
  Filled 2013-01-27 (×8): qty 2

## 2013-01-27 MED ORDER — ACETAMINOPHEN 325 MG PO TABS: 650.0000 mg | ORAL_TABLET | Freq: Four times a day (QID) | ORAL | Status: DC | PRN

## 2013-01-27 MED ORDER — SODIUM CHLORIDE 0.9 % IJ SOLN: 3.0000 mL | Freq: Two times a day (BID) | INTRAMUSCULAR | Status: DC

## 2013-01-27 MED ORDER — ONDANSETRON HCL 4 MG/2ML IJ SOLN
4.0000 mg | Freq: Four times a day (QID) | INTRAMUSCULAR | Status: DC | PRN
  Administered 2013-01-29: 4 mg via INTRAVENOUS
  Filled 2013-01-27 (×2): qty 2

## 2013-01-27 MED ORDER — METOPROLOL TARTRATE 100 MG PO TABS
100.0000 mg | ORAL_TABLET | Freq: Every day | ORAL | Status: DC
  Administered 2013-01-27 – 2013-01-31 (×5): 100 mg via ORAL
  Filled 2013-01-27 (×6): qty 1

## 2013-01-27 MED ORDER — POTASSIUM CHLORIDE CRYS ER 20 MEQ PO TBCR
40.0000 meq | EXTENDED_RELEASE_TABLET | Freq: Every day | ORAL | Status: DC
  Administered 2013-01-29 – 2013-01-31 (×3): 40 meq via ORAL
  Filled 2013-01-27 (×5): qty 2

## 2013-01-27 MED ORDER — SODIUM CHLORIDE 0.9 % IV BOLUS (SEPSIS)
500.0000 mL | Freq: Once | INTRAVENOUS | Status: AC
  Administered 2013-01-27: 500 mL via INTRAVENOUS

## 2013-01-27 MED ORDER — FUROSEMIDE 10 MG/ML IJ SOLN
INTRAMUSCULAR | Status: AC
  Filled 2013-01-27: qty 4

## 2013-01-27 MED ORDER — NITROGLYCERIN 0.4 MG SL SUBL: 0.4000 mg | SUBLINGUAL_TABLET | SUBLINGUAL | Status: DC | PRN

## 2013-01-27 MED ORDER — PANTOPRAZOLE SODIUM 40 MG PO TBEC
40.0000 mg | DELAYED_RELEASE_TABLET | Freq: Every day | ORAL | Status: DC
  Administered 2013-01-27 – 2013-01-31 (×5): 40 mg via ORAL
  Filled 2013-01-27 (×5): qty 1

## 2013-01-27 MED ORDER — ENOXAPARIN SODIUM 40 MG/0.4ML ~~LOC~~ SOLN
40.0000 mg | SUBCUTANEOUS | Status: DC
  Administered 2013-01-27 – 2013-01-29 (×3): 40 mg via SUBCUTANEOUS
  Filled 2013-01-27 (×4): qty 0.4

## 2013-01-27 MED ORDER — ONDANSETRON HCL 4 MG PO TABS
4.0000 mg | ORAL_TABLET | Freq: Four times a day (QID) | ORAL | Status: DC | PRN
  Administered 2013-01-28 – 2013-01-30 (×2): 4 mg via ORAL
  Filled 2013-01-27 (×2): qty 1

## 2013-01-27 MED ORDER — TRAMADOL HCL 50 MG PO TABS
100.0000 mg | ORAL_TABLET | Freq: Two times a day (BID) | ORAL | Status: DC | PRN
  Filled 2013-01-27: qty 2

## 2013-01-27 MED ORDER — GABAPENTIN 100 MG PO CAPS
100.0000 mg | ORAL_CAPSULE | Freq: Every day | ORAL | Status: DC
  Administered 2013-01-27 – 2013-01-30 (×4): 100 mg via ORAL
  Filled 2013-01-27 (×5): qty 1

## 2013-01-27 MED ORDER — SODIUM CHLORIDE 0.9 % IJ SOLN: 3.0000 mL | INTRAMUSCULAR | Status: DC | PRN

## 2013-01-27 MED ORDER — DIPHENOXYLATE-ATROPINE 2.5-0.025 MG PO TABS
2.0000 | ORAL_TABLET | Freq: Three times a day (TID) | ORAL | Status: DC
  Administered 2013-01-28 – 2013-01-31 (×11): 2 via ORAL
  Filled 2013-01-27 (×11): qty 2

## 2013-01-27 MED ORDER — SODIUM CHLORIDE 0.9 % IV SOLN: 250.0000 mL | INTRAVENOUS | Status: DC | PRN

## 2013-01-27 MED ORDER — ASPIRIN EC 325 MG PO TBEC
325.0000 mg | DELAYED_RELEASE_TABLET | Freq: Every day | ORAL | Status: DC
  Administered 2013-01-27 – 2013-01-31 (×5): 325 mg via ORAL
  Filled 2013-01-27 (×5): qty 1

## 2013-01-27 MED ORDER — ACETAMINOPHEN 650 MG RE SUPP: 650.0000 mg | Freq: Four times a day (QID) | RECTAL | Status: DC | PRN

## 2013-01-27 MED ORDER — CLOPIDOGREL BISULFATE 75 MG PO TABS
75.0000 mg | ORAL_TABLET | Freq: Every day | ORAL | Status: DC
  Administered 2013-01-27 – 2013-01-31 (×5): 75 mg via ORAL
  Filled 2013-01-27 (×6): qty 1

## 2013-01-27 MED ORDER — SODIUM CHLORIDE 0.9 % IJ SOLN
3.0000 mL | Freq: Two times a day (BID) | INTRAMUSCULAR | Status: DC
  Administered 2013-01-27 – 2013-01-31 (×7): 3 mL via INTRAVENOUS

## 2013-01-27 NOTE — ED Notes (Signed)
Family at bedside. 

## 2013-01-27 NOTE — ED Notes (Signed)
Patient transported to CT 

## 2013-01-27 NOTE — ED Notes (Signed)
MD at bedside. 

## 2013-01-27 NOTE — Progress Notes (Signed)
Pt had taken off O2. In no obvious distress but O2 sat only 79%. 3L [place] placed back on pt

## 2013-01-27 NOTE — H&P (Addendum)
[facility] History and Physical  Lori Travis UJW:[id number] DOB: Oct 30, 1918 DOA: 01/27/2013  Referring physician:  PCP: Nicki Reaper, NP  Specialists: none  Chief Complaint: Shortness of breath and diarrhea  HPI: Lori Travis is a 79 y.o. female with history of atrial fibrillation, CAD/status post MI in the past with coronary stents, CVA, hypertension  who presents with above complaints. She states that for the past one week she has had worsening shortness of breath and leg swelling. She admits to orthopnea. She denies chest pain. She was discharged from the hospital 7/7 following admission for diarrhea workup which was all negative, and she states she has continued to have diarrhea 3-4 times per day since discharge. She admits to nausea, vomitingx1. In the ED a chest x-ray was done which showed interstitial edema with bilateral pleural effusion and nodular opacities noted. A CT angiogram was obtained and came back negative for PE small bilateral pleural effusions and probable mild interstitial edema noted. No evidence of pulmonary consolidation or mass. She is admitted for further evaluation and management.  Review of Systems: The patient deniesfever, weight loss,, vision loss, decreased hearing, hoarseness, chest pain, syncope, peripheral edema, balance deficits, hemoptysis, abdominal pain, melena, hematochezia, severe indigestion/heartburn, hematuria, incontinence, genital sores, suspicious skin lesions, transient blindness, difficulty walking, depression, unusual weight change, abnormal bleeding   Past Medical History  Diagnosis Date  . History of chicken pox   . Allergy   . Heart disease   . Hypertension   . Stroke   . Atrial fibrillation   . MI (myocardial infarction)   . Stented coronary artery    History reviewed. No pertinent past surgical history. Social History:  reports that she has quit smoking. She has never used smokeless tobacco. She reports that she does not  drink alcohol or use illicit drugs.  where does patient live--home  Can patient participate in ADLs-yes  Allergies  Allergen Reactions  . Penicillins     unknown  . Sulfa Antibiotics     unknown    Family History  Problem Relation Age of Onset  . Heart disease Other     Parent, Grandparent  . Hypertension Other     Parent, Grandparent  . Cancer Mother   . Stroke Neg Hx     Prior to Admission medications   Medication Sig Start Date End Date Taking? Authorizing Provider  aspirin 325 MG EC tablet Take 325 mg by mouth daily.   Yes Historical Provider, MD  clopidogrel (PLAVIX) 75 MG tablet Take 75 mg by mouth daily.   Yes Historical Provider, MD  diphenoxylate-atropine (LOMOTIL) 2.5-0.025 MG per tablet Take 1 tablet by mouth 4 (four) times daily as needed for diarrhea or loose stools. 01/22/13  Yes Gavin Pound. Ghim, MD  gabapentin (NEURONTIN) 100 MG capsule Take 100 mg by mouth at bedtime.   Yes Historical Provider, MD  lansoprazole (PREVACID) 30 MG capsule Take 30 mg by mouth daily.   Yes Historical Provider, MD  magnesium oxide (MAG-OX) 400 (241.3 MG) MG tablet Take 1 tablet (400 mg total) by mouth 2 (two) times daily. 01/21/13  Yes Drema Dallas, MD  metoprolol (LOPRESSOR) 100 MG tablet Take 100 mg by mouth daily.   Yes Historical Provider, MD  nitroGLYCERIN (NITROSTAT) 0.4 MG SL tablet Place 0.4 mg under the tongue every 5 (five) minutes as needed for chest pain.   Yes Historical Provider, MD  ondansetron (ZOFRAN-ODT) 8 MG disintegrating tablet Take 1 tablet (8 mg total) by mouth every 12 (  twelve) hours as needed for nausea. 01/22/13  Yes Gavin Pound. Ghim, MD  traMADol (ULTRAM) 50 MG tablet Take 100 mg by mouth every 12 (twelve) hours as needed for pain. Back pain 01/21/13  Yes Drema Dallas, MD   Physical Exam: Filed Vitals:   01/27/13 0907 01/27/13 0945 01/27/13 1026 01/27/13 1449  BP: 153/98 127/90 147/91 166/122  Pulse:   108 66  Temp:    97.4 F (36.3 C)  TempSrc:    Axillary   Resp: 14 15 17 18   Height:    5\' 4"  (1.626 m)  Weight:    56 kg (123 lb 7.3 oz)  SpO2: 87%  100% 99%   Constitutional: Vital signs reviewed.  Patient is a well-developed and well-nourished  in no acute distress and cooperative with exam. Alert and oriented x3.  Head: Normocephalic and atraumatic Nose: No erythema or drainage noted.  Mouth: no erythema or exudates, MMM Eyes: PERRL, EOMI, conjunctivae normal, No scleral icterus.  Neck: Supple, Trachea midline normal ROM, No JVD, mass, thyromegaly, or carotid bruit present.  Cardiovascular: Irregular irregular, rate controlled, S1 normal, S2 normal, no MRG, pulses symmetric and intact bilaterally Pulmonary/Chest: crackles about 2/3 up lung fields bil, no wheezes Abdominal: Soft. Non-tender, non-distended, bowel sounds are normal, no masses, organomegaly, or guarding present.  Extremities: +1-2edema, no cyanosis  Neurological: A&O x3, Strength is normal and symmetric bilaterally, cranial nerve II-XII are grossly intact, no focal motor deficit, sensory intact to light touch bilaterally.  Skin: Warm, dry and intact. No rash Psychiatric: Normal mood and affect. speech and behavior is normal.   Labs on Admission:  Basic Metabolic Panel:  Recent Labs Lab 01/20/13 1556 01/21/13 0514 01/22/13 1515 01/27/13 0928  NA 136 137 134* 135  K 3.7 3.2* 3.0* 4.2  CL 101 102 98 98  CO2 26 23 24 24   GLUCOSE 100* 99 116* 90  BUN 12 12 15 10   CREATININE 0.76 0.70 0.81 0.92  CALCIUM 8.4 8.5 9.1 9.7  MG 1.8 1.6 1.6 1.6   Liver Function Tests:  Recent Labs Lab 01/20/13 1556 01/21/13 0514 01/27/13 0928  AST 14 14 21   ALT 8 8 16   ALKPHOS 102 101 134*  BILITOT 0.6 0.7 0.8  PROT 5.7* 5.5* 6.2  ALBUMIN 3.0* 2.9* 3.4*    Recent Labs Lab 01/27/13 0928  LIPASE 24   No results found for this basename: AMMONIA,  in the last 168 hours CBC:  Recent Labs Lab 01/27/13 0928  WBC 7.4  HGB 12.8  HCT 38.3  MCV 88.2  PLT 202   Cardiac  Enzymes:  Recent Labs Lab 01/27/13 0949  TROPONINI <0.30    BNP (last 3 results)  Recent Labs  01/27/13 1105  PROBNP 10541.0*   CBG: No results found for this basename: GLUCAP,  in the last 168 hours  Radiological Exams on Admission: Dg Chest 2 View  01/27/2013   *RADIOLOGY REPORT*  Clinical Data: Shortness of breath and diarrhea.  CHEST - 2 VIEW  Comparison: None.  Findings: There is evidence of interstitial edema and small bilateral pleural effusions.  Additional opacities in both lower lungs may represent infiltrates, areas of atelectasis or subtle mass lesions.  Follow-up is recommended.  The heart is mildly enlarged.  The bony thorax is unremarkable.  IMPRESSION: Interstitial edema with bilateral pleural effusions.  Additional opacities in both lower lungs present which are somewhat nodular in appearance.  These may represent areas of atelectasis, infiltrate or subtle masses.  Follow-up  chest x-ray is recommended.   Original Report Authenticated By: Irish Lack, M.D.   Ct Angio Chest Pe W/cm &/or Wo Cm  01/27/2013   *RADIOLOGY REPORT*  Clinical Data: Chest pain and shortness of breath.  CT ANGIOGRAPHY CHEST  Technique:  Multidetector CT imaging of the chest using the standard protocol during bolus administration of intravenous contrast. Multiplanar reconstructed images including MIPs were obtained and reviewed to evaluate the vascular anatomy.  Contrast: OMNIPAQUE IOHEXOL 350 MG/ML SOLN  Comparison: None.  Findings: Satisfactory opacification of the pulmonary arteries noted, and there is no evidence of pulmonary emboli.  Thoracic aorta is not well opacified on this exam, however there is no evidence of thoracic aortic aneurysm.  No evidence of mediastinal hematoma or mass.  Small pleural effusions are seen bilaterally.  Mild interstitial and central peribronchial thickening is seen, suspicious for mild interstitial edema.  No evidence of pulmonary consolidation or mass.  No  evidence of central endobronchial obstruction. Mild mediastinal lymphadenopathy in the right paratracheal region is likely reactive or secondary to lymphedema.  Moderate cardiomegaly is noted with mild right heart enlargement and reflux of IV contrast into the hepatic veins, consistent with right heart insufficiency.  IMPRESSION:  1.  No evidence of pulmonary embolism. 2.  Small bilateral pleural effusions and probable mild interstitial edema.  No evidence of pulmonary consolidation or mass. 3.  Cardiomegaly and right heart insufficiency.   Original Report Authenticated By: Myles Rosenthal, M.D.      Assessment/Plan Active Problems:    CHF, acute -diastolic versus CHF, await 2-D echo -As discussed above, in patient with atrial fibrillation and history of CAD/MI in past -Cycle cardiac enzymes, obtain 2-D echo -Start on IV Lasix and diuresis, continue aspirin and metoprolol   Diarrhea,chronic -Will recheck C. Difficile.PCR. GI pathogen panel from earlier this July negative -Will continue Lomotil-scheduled and follow. -Will also stop magnesium oxide as it can cause diarrhea. -Patient to followup with GI outpt   HTN (hypertension) -Continue metoprolol, follow   CAD  -Chest pain-free, continue aspirin Plavix and   GERD (gastroesophageal reflux disease) -Continue PPI   Atrial fibrillation -Continue metoprolol and ASA   \  Code Status:full Family Communication:son at bedside Disposition Plan:admit to tele  Time spent: >30  Kela Millin [facility] Pager [phone]  If 7PM-7AM, please contact night-coverage [email or web address] Password [facility] 01/27/2013, 3:47 PM

## 2013-01-27 NOTE — Progress Notes (Signed)
Attempted to call for report earlier- nurse unavailable

## 2013-01-27 NOTE — ED Notes (Signed)
Pt presents to the ED with diarrhea X 1 week, "fluid build up" and SHOB 3 days

## 2013-01-27 NOTE — ED Notes (Signed)
Attempted to call report per [employer].  Report had been called and taken by another RN due to RN being in a Contact room.

## 2013-01-27 NOTE — ED Provider Notes (Signed)
History    CSN: [id number] Arrival date & time 01/27/13  4098  First MD Initiated Contact with Patient 01/27/13 0919     Chief Complaint  Patient presents with  . Respiratory Distress  . Diarrhea   (Consider location/radiation/quality/duration/timing/severity/associated sxs/prior Treatment) Patient is a 79 y.o. female presenting with diarrhea. The history is provided by the patient.  Diarrhea Associated symptoms: no abdominal pain, no chills, no fever, no headaches and no vomiting   pt c/o diarrhea for the past 2 months. Currently 3-4 episodes per day, watery, brown, not bloody or mucousy. No abd pain or distension. No vomiting. Has had decreased appetite w unspecific wt loss during this period. Has had eval by her gi md w [facility], pcp and in hospital here 1-2 weeks ago - pt and family state no definitive dx. They indicate outpt ct had suspected chronic cholecystitis and possible pancreatic mass, but they state that subsequent mri was ok/normal.  Tests for c diff and stool studies also reported negative.  In past week pt also notes mild sob. No cough or uri c/o. No chest pain. No orthopnea or pnd. Denies fever or chills. No leg pain or swelling. No hx dvt or pe.     Past Medical History  Diagnosis Date  . History of chicken pox   . Allergy   . Heart disease   . Hypertension   . Stroke   . Atrial fibrillation   . MI (myocardial infarction)   . Stented coronary artery    History reviewed. No pertinent past surgical history. Family History  Problem Relation Age of Onset  . Heart disease Other     Parent, Grandparent  . Hypertension Other     Parent, Grandparent  . Cancer Mother   . Stroke Neg Hx    History  Substance Use Topics  . Smoking status: Former Games developer  . Smokeless tobacco: Never Used  . Alcohol Use: No   OB History   Grav Para Term Preterm Abortions TAB SAB Ect Mult Living                 Review of Systems  Constitutional: Negative for fever and  chills.  HENT: Negative for neck pain.   Eyes: Negative for redness.  Respiratory: Positive for shortness of breath.   Cardiovascular: Negative for chest pain.  Gastrointestinal: Positive for diarrhea. Negative for vomiting and abdominal pain.  Genitourinary: Negative for flank pain.  Musculoskeletal: Negative for back pain.  Skin: Negative for rash.  Neurological: Negative for headaches.  Hematological: Does not bruise/bleed easily.  Psychiatric/Behavioral: Negative for confusion.    Allergies  Penicillins and Sulfa antibiotics  Home Medications   Current Outpatient Rx  Name  Route  Sig  Dispense  Refill  . aspirin 325 MG EC tablet   Oral   Take 325 mg by mouth daily.         . clopidogrel (PLAVIX) 75 MG tablet   Oral   Take 75 mg by mouth daily.         . diphenoxylate-atropine (LOMOTIL) 2.5-0.025 MG per tablet   Oral   Take 1 tablet by mouth 4 (four) times daily as needed for diarrhea or loose stools.   30 tablet   0   . gabapentin (NEURONTIN) 100 MG capsule   Oral   Take 100 mg by mouth at bedtime.         . lansoprazole (PREVACID) 30 MG capsule   Oral   Take 30 mg  by mouth daily.         . magnesium oxide (MAG-OX) 400 (241.3 MG) MG tablet   Oral   Take 1 tablet (400 mg total) by mouth 2 (two) times daily.   120 tablet   3   . metoprolol (LOPRESSOR) 100 MG tablet   Oral   Take 100 mg by mouth daily.         . nitroGLYCERIN (NITROSTAT) 0.4 MG SL tablet   Sublingual   Place 0.4 mg under the tongue every 5 (five) minutes as needed for chest pain.         Marland Kitchen ondansetron (ZOFRAN-ODT) 8 MG disintegrating tablet   Oral   Take 1 tablet (8 mg total) by mouth every 12 (twelve) hours as needed for nausea.   20 tablet   0   . traMADol (ULTRAM) 50 MG tablet   Oral   Take 100 mg by mouth every 12 (twelve) hours as needed for pain. Back pain          BP 153/98  Pulse 107  Temp(Src) 98.1 F (36.7 C) (Oral)  Resp 14  SpO2 87% Physical Exam   Nursing note and vitals reviewed. Constitutional: She appears well-developed and well-nourished. No distress.  HENT:  Mouth/Throat: Oropharynx is clear and moist.  Eyes: Conjunctivae are normal. No scleral icterus.  Neck: Neck supple. No tracheal deviation present.  Cardiovascular: Normal rate, normal heart sounds and intact distal pulses.   Pulmonary/Chest: Effort normal and breath sounds normal. No respiratory distress.  Abdominal: Soft. Normal appearance and bowel sounds are normal. She exhibits no distension and no mass. There is no tenderness. There is no rebound and no guarding.  Genitourinary:  No cva tenderness  Musculoskeletal: She exhibits no edema.  Neurological: She is alert.  Skin: Skin is warm and dry. No rash noted.  Psychiatric: She has a normal mood and affect.    ED Course  Procedures (including critical care time)  Results for orders placed during the hospital encounter of 01/27/13  CLOSTRIDIUM DIFFICILE BY PCR      Result Value Range   C difficile by pcr NEGATIVE  NEGATIVE  CBC      Result Value Range   WBC 7.4  4.0 - 10.5 K/uL   RBC 4.34  3.87 - 5.11 MIL/uL   Hemoglobin 12.8  12.0 - 15.0 g/dL   HCT 16.1  09.6 - 04.5 %   MCV 88.2  78.0 - 100.0 fL   MCH 29.5  26.0 - 34.0 pg   MCHC 33.4  30.0 - 36.0 g/dL   RDW 40.9  81.1 - 91.4 %   Platelets 202  150 - 400 K/uL  COMPREHENSIVE METABOLIC PANEL      Result Value Range   Sodium 135  135 - 145 mEq/L   Potassium 4.2  3.5 - 5.1 mEq/L   Chloride 98  96 - 112 mEq/L   CO2 24  19 - 32 mEq/L   Glucose, Bld 90  70 - 99 mg/dL   BUN 10  6 - 23 mg/dL   Creatinine, Ser 7.82  0.50 - 1.10 mg/dL   Calcium 9.7  8.4 - 95.6 mg/dL   Total Protein 6.2  6.0 - 8.3 g/dL   Albumin 3.4 (*) 3.5 - 5.2 g/dL   AST 21  0 - 37 U/L   ALT 16  0 - 35 U/L   Alkaline Phosphatase 134 (*) 39 - 117 U/L   Total Bilirubin 0.8  0.3 -  1.2 mg/dL   GFR calc non Af Amer 52 (*) >90 mL/min   GFR calc Af Amer 61 (*) >90 mL/min  URINALYSIS, ROUTINE  W REFLEX MICROSCOPIC      Result Value Range   Color, Urine AMBER (*) YELLOW   APPearance CLEAR  CLEAR   Specific Gravity, Urine 1.016  1.005 - 1.030   pH 5.0  5.0 - 8.0   Glucose, UA NEGATIVE  NEGATIVE mg/dL   Hgb urine dipstick NEGATIVE  NEGATIVE   Bilirubin Urine SMALL (*) NEGATIVE   Ketones, ur 15 (*) NEGATIVE mg/dL   Protein, ur 30 (*) NEGATIVE mg/dL   Urobilinogen, UA 0.2  0.0 - 1.0 mg/dL   Nitrite NEGATIVE  NEGATIVE   Leukocytes, UA SMALL (*) NEGATIVE  MAGNESIUM      Result Value Range   Magnesium 1.6  1.5 - 2.5 mg/dL  LIPASE, BLOOD      Result Value Range   Lipase 24  11 - 59 U/L  TROPONIN I      Result Value Range   Troponin I <0.30  <0.30 ng/mL  LACTIC ACID, PLASMA      Result Value Range   Lactic Acid, Venous 1.6  0.5 - 2.2 mmol/L  PRO B NATRIURETIC PEPTIDE      Result Value Range   Pro B Natriuretic peptide (BNP) 10541.0 (*) 0 - 450 pg/mL  URINE MICROSCOPIC-ADD ON      Result Value Range   Squamous Epithelial / LPF FEW (*) RARE   WBC, UA 3-6  <3 WBC/hpf   Bacteria, UA RARE  RARE  CBC      Result Value Range   WBC 6.7  4.0 - 10.5 K/uL   RBC 4.02  3.87 - 5.11 MIL/uL   Hemoglobin 11.8 (*) 12.0 - 15.0 g/dL   HCT 96.0 (*) 45.4 - 09.8 %   MCV 88.1  78.0 - 100.0 fL   MCH 29.4  26.0 - 34.0 pg   MCHC 33.3  30.0 - 36.0 g/dL   RDW 11.9  14.7 - 82.9 %   Platelets 240  150 - 400 K/uL  CREATININE, SERUM      Result Value Range   Creatinine, Ser 0.90  0.50 - 1.10 mg/dL   GFR calc non Af Amer 54 (*) >90 mL/min   GFR calc Af Amer 62 (*) >90 mL/min  TROPONIN I      Result Value Range   Troponin I <0.30  <0.30 ng/mL  TROPONIN I      Result Value Range   Troponin I <0.30  <0.30 ng/mL  TROPONIN I      Result Value Range   Troponin I <0.30  <0.30 ng/mL  BASIC METABOLIC PANEL      Result Value Range   Sodium 136  135 - 145 mEq/L   Potassium 3.0 (*) 3.5 - 5.1 mEq/L   Chloride 98  96 - 112 mEq/L   CO2 26  19 - 32 mEq/L   Glucose, Bld 78  70 - 99 mg/dL   BUN 11  6  - 23 mg/dL   Creatinine, Ser 5.62  0.50 - 1.10 mg/dL   Calcium 9.0  8.4 - 13.0 mg/dL   GFR calc non Af Amer 53 (*) >90 mL/min   GFR calc Af Amer 62 (*) >90 mL/min   Dg Chest 2 View  01/27/2013   *RADIOLOGY REPORT*  Clinical Data: Shortness of breath and diarrhea.  CHEST - 2 VIEW  Comparison: None.  Findings: There is evidence of interstitial edema and small bilateral pleural effusions.  Additional opacities in both lower lungs may represent infiltrates, areas of atelectasis or subtle mass lesions.  Follow-up is recommended.  The heart is mildly enlarged.  The bony thorax is unremarkable.  IMPRESSION: Interstitial edema with bilateral pleural effusions.  Additional opacities in both lower lungs present which are somewhat nodular in appearance.  These may represent areas of atelectasis, infiltrate or subtle masses.  Follow-up chest x-ray is recommended.   Original Report Authenticated By: Irish Lack, M.D.   Ct Angio Chest Pe W/cm &/or Wo Cm  01/27/2013   *RADIOLOGY REPORT*  Clinical Data: Chest pain and shortness of breath.  CT ANGIOGRAPHY CHEST  Technique:  Multidetector CT imaging of the chest using the standard protocol during bolus administration of intravenous contrast. Multiplanar reconstructed images including MIPs were obtained and reviewed to evaluate the vascular anatomy.  Contrast: OMNIPAQUE IOHEXOL 350 MG/ML SOLN  Comparison: None.  Findings: Satisfactory opacification of the pulmonary arteries noted, and there is no evidence of pulmonary emboli.  Thoracic aorta is not well opacified on this exam, however there is no evidence of thoracic aortic aneurysm.  No evidence of mediastinal hematoma or mass.  Small pleural effusions are seen bilaterally.  Mild interstitial and central peribronchial thickening is seen, suspicious for mild interstitial edema.  No evidence of pulmonary consolidation or mass.  No evidence of central endobronchial obstruction. Mild mediastinal lymphadenopathy in the  right paratracheal region is likely reactive or secondary to lymphedema.  Moderate cardiomegaly is noted with mild right heart enlargement and reflux of IV contrast into the hepatic veins, consistent with right heart insufficiency.  IMPRESSION:  1.  No evidence of pulmonary embolism. 2.  Small bilateral pleural effusions and probable mild interstitial edema.  No evidence of pulmonary consolidation or mass. 3.  Cardiomegaly and right heart insufficiency.   Original Report Authenticated By: Myles Rosenthal, M.D.     MDM  Iv ns bolus. Labs. Cxr.  Reviewed nursing notes and prior charts for additional history.    Date: 01/27/2013  Rate: 106  Rhythm: atrial fibrillation  QRS Axis: normal  Intervals: normal  ST/T Wave abnormalities: nonspecific ST/T changes  Conduction Disutrbances:right bundle branch block  Narrative Interpretation:   Old EKG Reviewed: unchanged  Given weakness, hypoxia, pleural effusions, persistent diarrhea, med service called to admit.    Suzi Roots, MD 01/28/13 [phone]

## 2013-01-28 DIAGNOSIS — E876 Hypokalemia: Secondary | ICD-10-CM

## 2013-01-28 LAB — BASIC METABOLIC PANEL
BUN: 11 mg/dL (ref 6–23)
Calcium: 9 mg/dL (ref 8.4–10.5)
Chloride: 98 mEq/L (ref 96–112)
Creatinine, Ser: 0.91 mg/dL (ref 0.50–1.10)
GFR calc Af Amer: 62 mL/min — ABNORMAL LOW (ref >90)
GFR calc non Af Amer: 53 mL/min — ABNORMAL LOW (ref >90)

## 2013-01-28 LAB — TROPONIN I: Troponin I: <0.3 ng/mL (ref <0.30)

## 2013-01-28 LAB — CLOSTRIDIUM DIFFICILE BY PCR: Toxigenic C. Difficile by PCR: NEGATIVE

## 2013-01-28 MED ORDER — FUROSEMIDE 10 MG/ML IJ SOLN
INTRAMUSCULAR | Status: AC
Start: 2013-01-28 — End: 2013-01-28
  Filled 2013-01-28: qty 4

## 2013-01-28 MED ORDER — POTASSIUM CHLORIDE CRYS ER 20 MEQ PO TBCR
40.0000 meq | EXTENDED_RELEASE_TABLET | ORAL | Status: AC
  Administered 2013-01-28 (×3): 40 meq via ORAL
  Filled 2013-01-28 (×2): qty 2

## 2013-01-28 NOTE — Progress Notes (Signed)
  Echocardiogram 2D Echocardiogram has been performed.  Arvil Chaco 01/28/2013, 9:59 AM

## 2013-01-28 NOTE — Progress Notes (Signed)
UR COMPLETED  

## 2013-01-28 NOTE — Evaluation (Signed)
Physical Therapy Evaluation Patient Details Name: Lori Travis MRN: [id number] DOB: [birth year] Today's Date: 01/28/2013 Time: 7425-9563 PT Time Calculation (min): 30 min  PT Assessment / Plan / Recommendation History of Present Illness  Lori Travis is a 79 y.o. female with history of atrial fibrillation, CAD/status post MI in the past with coronary stents, CVA, hypertension who presents with above complaints. She states that for the past one week she has had worsening shortness of breath and leg swelling. She admits to orthopnea. She denies chest pain. She was discharged from the hospital 7/7 following admission for diarrhea workup which was all negative, and she states she has continued to have diarrhea 3-4 times per day since discharge. She admits to nausea, vomitingx1. In the ED a chest x-ray was done which showed interstitial edema with bilateral pleural effusion and nodular opacities noted  Clinical Impression  Pt admitted with above. Pt currently requires minA for safe out of bed mobility and ADls. Family unable to provide this level of care upon d/c. Pt to benefit from ST-SNF placement to address mentioned deficits and to achieve safe mod I function for safe transition back home.     PT Assessment  Patient needs continued PT services    Follow Up Recommendations  SNF;Supervision/Assistance - 24 hour    Does the patient have the potential to tolerate intense rehabilitation      Barriers to Discharge Decreased caregiver support family unable to provide 24/7 assist to pt    Equipment Recommendations       Recommendations for Other Services     Frequency Min 3X/week    Precautions / Restrictions Precautions Precautions: Fall Restrictions Weight Bearing Restrictions: No   Pertinent Vitals/Pain Denies pain      Mobility  Bed Mobility Bed Mobility: Supine to Sit;Sitting - Scoot to Edge of Bed Supine to Sit: 5: Supervision;HOB elevated Sitting - Scoot to [place] of Bed: 5:  Supervision Transfers Transfers: Sit to Stand;Stand to Sit Sit to Stand: 4: Min assist;With upper extremity assist;From bed Stand to Sit: 4: Min assist;With upper extremity assist;With armrests;To chair/3-in-1 Details for Transfer Assistance: v/c's for hand placement, increased time Ambulation/Gait Ambulation/Gait Assistance: 4: Min guard Ambulation Distance (Feet): 100 Feet Assistive device: Rolling walker Ambulation/Gait Assistance Details: v/c's for walker management, v/c's for direction during turning with walker, v/c's to stay in walker Gait Pattern: Step-through pattern;Decreased stride length;Shuffle Gait velocity: slow Stairs: No    Exercises     PT Diagnosis: Difficulty walking;Generalized weakness  PT Problem List: Decreased strength;Decreased activity tolerance;Decreased balance;Decreased mobility PT Treatment Interventions: DME instruction;Gait training;Stair training;Functional mobility training     PT Goals(Current goals can be found in the care plan section) Acute Rehab PT Goals Patient Stated Goal: to get stronger PT Goal Formulation: With patient/family Time For Goal Achievement: 02/11/13 Potential to Achieve Goals: Good  Visit Information  Last PT Received On: 05/23/13 Assistance Needed: +1 PT/OT Co-Evaluation/Treatment: Yes History of Present Illness: Lori Travis is a 79 y.o. female with history of atrial fibrillation, CAD/status post MI in the past with coronary stents, CVA, hypertension who presents with above complaints. She states that for the past one week she has had worsening shortness of breath and leg swelling. She admits to orthopnea. She denies chest pain. She was discharged from the hospital 7/7 following admission for diarrhea workup which was all negative, and she states she has continued to have diarrhea 3-4 times per day since discharge. She admits to nausea, vomitingx1. In the ED a chest x-ray  was done which showed interstitial edema with  bilateral pleural effusion and nodular opacities noted       Prior Functioning  Home Living Family/patient expects to be discharged to:: Private residence Living Arrangements: Children Available Help at Discharge: Family;Available 24 hours/day Type of Home: House Home Access: Stairs to enter [employer] of Steps: 3 Entrance Stairs-Rails: None Home Layout: One level Home Equipment: Walker - 2 wheels;Cane - single point;Bedside commode;Shower seat;Wheelchair - manual Prior Function Level of Independence: Needs assistance Gait / Transfers Assistance Needed: Min guard A with SPC ADL's / Homemaking Assistance Needed: A with bathing, could dress herself post set up Communication / Swallowing Assistance Needed: has to have pills crushed otherwise it makes her gag Communication Communication: HOH Dominant Hand: Right    Cognition  Cognition Arousal/Alertness: Awake/alert Behavior During Therapy: WFL for tasks assessed/performed Overall Cognitive Status: Within Functional Limits for tasks assessed    Extremity/Trunk Assessment Upper Extremity Assessment Upper Extremity Assessment: Overall WFL for tasks assessed Lower Extremity Assessment Lower Extremity Assessment: Overall WFL for tasks assessed Cervical / Trunk Assessment Cervical / Trunk Assessment: Kyphotic   Balance    End of Session PT - End of Session Equipment Utilized During Treatment: Gait belt Activity Tolerance: Patient tolerated treatment well Patient left: in chair;with call bell/phone within reach;with family/visitor present Nurse Communication: Mobility status  GP     Marcene Brawn 01/28/2013, 12:40 PM   Lewis Shock, PT, DPT Pager #: [phone] Office #: [phone]

## 2013-01-28 NOTE — Plan of Care (Signed)
Problem: Phase I Progression Outcomes Goal: Pain controlled with appropriate interventions Outcome: Completed/Met Date Met:  01/28/13 Pt has not had any c/o pain Goal: OOB as tolerated unless otherwise ordered Outcome: Completed/Met Date Met:  01/28/13 Pt oob with assist x 1 to bathroom tolerates well Goal: Voiding-avoid urinary catheter unless indicated Outcome: Completed/Met Date Met:  01/28/13 Voiding adequate amts of urine no need for foley

## 2013-01-28 NOTE — Evaluation (Signed)
Occupational Therapy Evaluation Patient Details Name: [name] MRN: [id number] DOB: May 28, 1919 Today's Date: 01/28/2013 Time: 4098-1191 OT Time Calculation (min): 33 min  OT Assessment / Plan / Recommendation History of present illness Lori Travis is a 79 y.o. female with history of atrial fibrillation, CAD/status post MI in the past with coronary stents, CVA, hypertension who presents with above complaints. She states that for the past one week she has had worsening shortness of breath and leg swelling. She admits to orthopnea. She denies chest pain. She was discharged from the hospital 7/7 following admission for diarrhea workup which was all negative, and she states she has continued to have diarrhea 3-4 times per day since discharge. She admits to nausea, vomitingx1. In the ED a chest x-ray was done which showed interstitial edema with bilateral pleural effusion and nodular opacities noted   Clinical Impression   Pt admitted with above. Pt currently with functional limitations due to the deficits listed below (see OT Problem List).  Pt will benefit from skilled OT to increase their safety and independence with ADL and functional mobility for ADL to facilitate discharge to venue listed below.       OT Assessment  Patient needs continued OT Services    Follow Up Recommendations  SNF    Barriers to Discharge Decreased caregiver support    Equipment Recommendations  None recommended by OT       Frequency  Min 2X/week    Precautions / Restrictions Precautions Precautions: Fall Restrictions Weight Bearing Restrictions: No       ADL  Eating/Feeding: Independent Where Assessed - Eating/Feeding: Chair Grooming: Set up;Supervision/safety Where Assessed - Grooming: Unsupported sitting Upper Body Bathing: Simulated;Set up;Supervision/safety Where Assessed - Upper Body Bathing: Unsupported sitting Lower Body Bathing: Performed;Min guard Where Assessed - Lower Body Bathing:  Supported sit to stand Upper Body Dressing: Simulated;Set up;Supervision/safety Where Assessed - Upper Body Dressing: Unsupported sitting Lower Body Dressing: Performed;Min guard Where Assessed - Lower Body Dressing: Supported sit to stand Toilet Transfer: Performed;Minimal assistance Toilet Transfer Method: Sit to Barista: Materials engineer and Hygiene: Performed;Minimal assistance Where Assessed - Engineer, mining and Hygiene: Sit to stand from 3-in-1 or toilet Equipment Used: Gait belt;Rolling walker;Cane Transfers/Ambulation Related to ADLs: Min A for all with RW, tried SPC at first but pt felt too unsteady    OT Diagnosis: Generalized weakness  OT Problem List: Decreased strength;Impaired balance (sitting and/or standing);Decreased knowledge of use of DME or AE OT Treatment Interventions: Self-care/ADL training;Balance training;Patient/family education;DME and/or AE instruction;Therapeutic activities   OT Goals(Current goals can be found in the care plan section) Acute Rehab OT Goals OT Goal Formulation: With patient Time For Goal Achievement: 02/04/13 Potential to Achieve Goals: Good  Visit Information  Last OT Received On: 01/28/13 Assistance Needed: +1 PT/OT Co-Evaluation/Treatment: Yes History of Present Illness: Lori Travis is a 79 y.o. female with history of atrial fibrillation, CAD/status post MI in the past with coronary stents, CVA, hypertension who presents with above complaints. She states that for the past one week she has had worsening shortness of breath and leg swelling. She admits to orthopnea. She denies chest pain. She was discharged from the hospital 7/7 following admission for diarrhea workup which was all negative, and she states she has continued to have diarrhea 3-4 times per day since discharge. She admits to nausea, vomitingx1. In the ED a chest x-ray was done which showed interstitial  edema with bilateral pleural effusion and nodular opacities  noted       Prior Functioning     Home Living Family/patient expects to be discharged to:: Private residence Living Arrangements: Children Available Help at Discharge: Family;Available 24 hours/day Type of Home: House Home Access: Stairs to enter [employer] of Steps: 3 Entrance Stairs-Rails: None Home Layout: One level Home Equipment: Walker - 2 wheels;Cane - single point;Bedside commode;Shower seat;Wheelchair - manual Prior Function Level of Independence: Needs assistance Gait / Transfers Assistance Needed: Min guard A with SPC ADL's / Homemaking Assistance Needed: A with bathing, could dress herself post set up Communication / Swallowing Assistance Needed: has to have pills crushed otherwise it makes her gag Communication Communication: HOH (has Bil hearing aids) Dominant Hand: Right         Vision/Perception Vision - History Baseline Vision: Wears glasses all the time Patient Visual Report: No change from baseline   Cognition  Cognition Arousal/Alertness: Awake/alert Behavior During Therapy: WFL for tasks assessed/performed Overall Cognitive Status: Within Functional Limits for tasks assessed    Extremity/Trunk Assessment Upper Extremity Assessment Upper Extremity Assessment: Overall WFL for tasks assessed Lower Extremity Assessment Lower Extremity Assessment: Overall WFL for tasks assessed     Mobility Bed Mobility Bed Mobility: Supine to Sit;Sitting - Scoot to Edge of Bed Supine to Sit: 5: Supervision;HOB elevated Sitting - Scoot to [place] of Bed: 5: Supervision Transfers Transfers: Sit to Stand;Stand to Sit Sit to Stand: 4: Min assist;With upper extremity assist;From bed Stand to Sit: 4: Min assist;With upper extremity assist;With armrests;To chair/3-in-1           End of Session OT - End of Session Equipment Utilized During Treatment: Gait belt;Rolling walker Activity Tolerance:  Patient tolerated treatment well Patient left: in chair;with call bell/phone within reach;with family/visitor present       Evette Georges [phone] 01/28/2013, 11:46 AM

## 2013-01-28 NOTE — Progress Notes (Addendum)
[facility] PROGRESS NOTE  Lori Travis ZOX:[id number] DOB: 08/24/1918 DOA: 01/27/2013 PCP: Nicki Reaper, NP  Assessment/Plan: Active Problems:  CHF, acute diastolic  -As discussed above, in patient with atrial fibrillation and history of CAD/MI in past  -cardiac enzymes so far neg,  2-D echo with EF 50% -continue IV Lasix and diuresis(1.3l neg overnight), continue aspirin and metoprolol  Diarrhea,chronic  -C. Difficile.PCR  neg. GI pathogen panel from earlier this July negative  -Will continue Lomotil, diarrhea improving-  follow.  - magnesium oxide dc'ed on admission as it can cause diarrhea.  -Patient to followup with GI outpt  HTN (hypertension)  -Continue metoprolol, follow  CAD  -Chest pain-free, continue aspirin Plavix and  GERD (gastroesophageal reflux disease)  -Continue PPI  Atrial fibrillation  -Continue metoprolol and ASA  Nausea vomiting -follow up urine culture -continue PP for GERD Deconditioning -PT/OT recommending SNF     Code Status: full Family Communication: none at bedside Disposition Plan: likely to SNF when medically ready   Consultants:  none  Procedures:  Echo   Study Conclusions  - Left ventricle: The cavity size was normal. Systolic function was normal. The estimated ejection fraction was 50%. - Mitral valve: Moderate diffuse thickening and calcification. Mild regurgitation. - Left atrium: The atrium was moderately dilated. - Right atrium: The atrium was moderately dilated. - Pulmonary arteries:   Antibiotics:  none  HPI/Subjective: Vomited this am, but tolerated lunch well and no further diarrhea today  Objective: Filed Vitals:   01/27/13 2128 01/28/13 0631 01/28/13 0947 01/28/13 1335  BP: 135/91 143/94 144/80 138/69  Pulse: 84 80  95  Temp: 97.8 F (36.6 C) 97.9 F (36.6 C)  97.6 F (36.4 C)  TempSrc: Oral Oral  Oral  Resp: 19 20  20   Height:      Weight:  53.8 kg (118 lb 9.7 oz)    SpO2: 99% 92%  98%     Intake/Output Summary (Last 24 hours) at 01/28/13 1417 Last data filed at 01/28/13 1239  Gross per 24 hour  Intake    960 ml  Output   2278 ml  Net  -1318 ml   Filed Weights   01/27/13 1449 01/27/13 1600 01/28/13 0631  Weight: 56 kg (123 lb 7.3 oz) 55.5 kg (122 lb 5.7 oz) 53.8 kg (118 lb 9.7 oz)   Exam:  General: alert & appropriate  In NAD Cardiovascular: RRR, nl S1 s2 Respiratory: CTAB Abdomen: soft +BS NT/ND, no masses palpable Extremities: No cyanosis and +1-2edema    Data Reviewed: Basic Metabolic Panel:  Recent Labs Lab 01/22/13 1515 01/27/13 0928 01/27/13 1555 01/28/13 0544  NA 134* 135  --  136  K 3.0* 4.2  --  3.0*  CL 98 98  --  98  CO2 24 24  --  26  GLUCOSE 116* 90  --  78  BUN 15 10  --  11  CREATININE 0.81 0.92 0.90 0.91  CALCIUM 9.1 9.7  --  9.0  MG 1.6 1.6  --   --    Liver Function Tests:  Recent Labs Lab 01/27/13 0928  AST 21  ALT 16  ALKPHOS 134*  BILITOT 0.8  PROT 6.2  ALBUMIN 3.4*    Recent Labs Lab 01/27/13 0928  LIPASE 24   No results found for this basename: AMMONIA,  in the last 168 hours CBC:  Recent Labs Lab 01/27/13 0928 01/27/13 1555  WBC 7.4 6.7  HGB 12.8 11.8*  HCT 38.3 35.4*  MCV  88.2 88.1  PLT 202 240   Cardiac Enzymes:  Recent Labs Lab 01/27/13 0949 01/27/13 1555 01/27/13 2150 01/28/13 0544  TROPONINI <0.30 <0.30 <0.30 <0.30   BNP (last 3 results)  Recent Labs  01/27/13 1105  PROBNP 10541.0*   CBG: No results found for this basename: GLUCAP,  in the last 168 hours  Recent Results (from the past 240 hour(s))  URINE CULTURE     Status: None   Collection Time    01/19/13  2:09 PM      Result Value Range Status   Specimen Description URINE, RANDOM   Final   Special Requests NONE   Final   Culture  Setup Time 01/20/2013 02:57   Final   Colony Count >=100,000 COLONIES/ML   Final   Culture     Final   Value: Multiple bacterial morphotypes present, none predominant. Suggest appropriate  recollection if clinically indicated.   Report Status 01/21/2013 FINAL   Final  OVA AND PARASITE EXAMINATION     Status: None   Collection Time    01/19/13  7:24 PM      Result Value Range Status   Specimen Description STOOL   Final   Special Requests NONE   Final   Ova and parasites NO OVA OR PARASITES SEEN   Final   Report Status 01/22/2013 FINAL   Final  STOOL CULTURE     Status: None   Collection Time    01/19/13  7:24 PM      Result Value Range Status   Specimen Description STOOL   Final   Special Requests NONE   Final   Culture     Final   Value: NO SALMONELLA, SHIGELLA, CAMPYLOBACTER, YERSINIA, OR E.COLI 0157:H7 ISOLATED   Report Status 01/23/2013 FINAL   Final  CLOSTRIDIUM DIFFICILE BY PCR     Status: None   Collection Time    01/19/13  7:24 PM      Result Value Range Status   C difficile by pcr NEGATIVE  NEGATIVE Final  CLOSTRIDIUM DIFFICILE BY PCR     Status: None   Collection Time    01/27/13 11:11 PM      Result Value Range Status   C difficile by pcr NEGATIVE  NEGATIVE Final     Studies: Dg Chest 2 View  01/27/2013   *RADIOLOGY REPORT*  Clinical Data: Shortness of breath and diarrhea.  CHEST - 2 VIEW  Comparison: None.  Findings: There is evidence of interstitial edema and small bilateral pleural effusions.  Additional opacities in both lower lungs may represent infiltrates, areas of atelectasis or subtle mass lesions.  Follow-up is recommended.  The heart is mildly enlarged.  The bony thorax is unremarkable.  IMPRESSION: Interstitial edema with bilateral pleural effusions.  Additional opacities in both lower lungs present which are somewhat nodular in appearance.  These may represent areas of atelectasis, infiltrate or subtle masses.  Follow-up chest x-ray is recommended.   Original Report Authenticated By: Irish Lack, M.D.   Ct Angio Chest Pe W/cm &/or Wo Cm  01/27/2013   *RADIOLOGY REPORT*  Clinical Data: Chest pain and shortness of breath.  CT ANGIOGRAPHY  CHEST  Technique:  Multidetector CT imaging of the chest using the standard protocol during bolus administration of intravenous contrast. Multiplanar reconstructed images including MIPs were obtained and reviewed to evaluate the vascular anatomy.  Contrast: OMNIPAQUE IOHEXOL 350 MG/ML SOLN  Comparison: None.  Findings: Satisfactory opacification of the pulmonary arteries noted, and there  is no evidence of pulmonary emboli.  Thoracic aorta is not well opacified on this exam, however there is no evidence of thoracic aortic aneurysm.  No evidence of mediastinal hematoma or mass.  Small pleural effusions are seen bilaterally.  Mild interstitial and central peribronchial thickening is seen, suspicious for mild interstitial edema.  No evidence of pulmonary consolidation or mass.  No evidence of central endobronchial obstruction. Mild mediastinal lymphadenopathy in the right paratracheal region is likely reactive or secondary to lymphedema.  Moderate cardiomegaly is noted with mild right heart enlargement and reflux of IV contrast into the hepatic veins, consistent with right heart insufficiency.  IMPRESSION:  1.  No evidence of pulmonary embolism. 2.  Small bilateral pleural effusions and probable mild interstitial edema.  No evidence of pulmonary consolidation or mass. 3.  Cardiomegaly and right heart insufficiency.   Original Report Authenticated By: Myles Rosenthal, M.D.    Scheduled Meds: . aspirin  325 mg Oral Daily  . clopidogrel  75 mg Oral Q breakfast  . diphenoxylate-atropine  2 tablet Oral TID  . enoxaparin (LOVENOX) injection  40 mg Subcutaneous Q24H  . furosemide      . furosemide  20 mg Intravenous BID  . gabapentin  100 mg Oral QHS  . metoprolol  100 mg Oral Daily  . pantoprazole  40 mg Oral Daily  . potassium chloride  40 mEq Oral Daily  . potassium chloride  40 mEq Oral Q4H  . sodium chloride  3 mL Intravenous Q12H   Continuous Infusions:   Active Problems:   HTN (hypertension)   GERD  (gastroesophageal reflux disease)   Diarrhea   Atrial fibrillation   CHF, acute    Time spent: 35    [facility]  [facility] Pager [phone]. If 7PM-7AM, please contact night-coverage at [email or web address], password [facility] 01/28/2013, 2:17 PM  LOS: 1 day

## 2013-01-29 LAB — PRO B NATRIURETIC PEPTIDE: Pro B Natriuretic peptide (BNP): 10095 pg/mL — ABNORMAL HIGH (ref 0–450)

## 2013-01-29 MED ORDER — POTASSIUM CHLORIDE CRYS ER 20 MEQ PO TBCR
40.0000 meq | EXTENDED_RELEASE_TABLET | ORAL | Status: AC
  Administered 2013-01-29 – 2013-01-30 (×2): 40 meq via ORAL
  Filled 2013-01-29 (×2): qty 2

## 2013-01-29 NOTE — Plan of Care (Signed)
Problem: Phase I Progression Outcomes Goal: Initial discharge plan identified Outcome: Completed/Met Date Met:  01/29/13 Initial plan is to go to snf for rehab  Problem: Phase II Progression Outcomes Goal: Vital signs remain stable Outcome: Completed/Met Date Met:  01/29/13 Vss, will monitor  Problem: Phase III Progression Outcomes Goal: Voiding independently Outcome: Completed/Met Date Met:  01/29/13 Pt voiding adequate amts of urine

## 2013-01-29 NOTE — Progress Notes (Signed)
Occupational Therapy Treatment Patient Details Name: Lori Travis MRN: [id number] DOB: 1918/09/11 Today's Date: 01/29/2013 Time: 6644-0347 OT Time Calculation (min): 26 min  OT Assessment / Plan / Recommendation  OT comments  Pt motivated  Follow Up Recommendations  SNF       Equipment Recommendations  None recommended by OT       Frequency Min 2X/week   Progress towards OT Goals Progress towards OT goals: Progressing toward goals  Plan Discharge plan remains appropriate    Precautions / Restrictions Precautions Precautions: Fall Restrictions Weight Bearing Restrictions: No       ADL  Grooming: Performed;Teeth care;Wash/dry face;Denture care;Brushing hair;Set up Where Assessed - Grooming: Unsupported sitting Toilet Transfer: Performed;Minimal assistance Toilet Transfer Method: Sit to stand Toilet Transfer Equipment: Bedside commode Toileting - Clothing Manipulation and Hygiene: Performed;Minimal assistance Transfers/Ambulation Related to ADLs: verbal cue for safety and hand placement        Visit Information  Last OT Received On: 01/29/13 Assistance Needed: +1 History of Present Illness: Lori Travis is a 79 y.o. female with history of atrial fibrillation, CAD/status post MI in the past with coronary stents, CVA, hypertension who presents with above complaints. She states that for the past one week she has had worsening shortness of breath and leg swelling. She admits to orthopnea. She denies chest pain. She was discharged from the hospital 7/7 following admission for diarrhea workup which was all negative, and she states she has continued to have diarrhea 3-4 times per day since discharge. She admits to nausea, vomitingx1. In the ED a chest x-ray was done which showed interstitial edema with bilateral pleural effusion and nodular opacities noted          Cognition  Cognition Arousal/Alertness: Awake/alert Behavior During Therapy: WFL for tasks  assessed/performed Overall Cognitive Status: Within Functional Limits for tasks assessed    Mobility  Transfers Transfers: Sit to Stand;Stand to Sit Sit to Stand: 4: Min assist;From chair/3-in-1;With upper extremity assist Stand to Sit: 4: Min assist;To chair/3-in-1;With upper extremity assist          End of Session OT - End of Session Patient left: in chair;with call bell/phone within reach  GO     [facility] [place], [place] 01/29/2013, 11:31 AM

## 2013-01-29 NOTE — Progress Notes (Addendum)
[facility] PROGRESS NOTE  Lori Travis WUJ:[id number] DOB: [birth year] DOA: 01/27/2013 PCP: Nicki Reaper, NP  Assessment/Plan: Active Problems:  CHF, acute diastolic  -As discussed above, in patient with atrial fibrillation and history of CAD/MI in past  -cardiac enzymes so far neg,  2-D echo with EF 50% -clinically improving, continue Lasix and diuresis(1.5l neg in past 24hrs), BNP decreasing but not significantly>> follow and change to po as apprpriate  -continue aspirin and metoprolol  Diarrhea,chronic  -C. Difficile.PCR  neg. GI pathogen panel from earlier this July negative  -diarrhea improving, Will continue Lomotil scheduled, follow and change to prn as appropriate - magnesium oxide dc'ed on admission as it can cause diarrhea.  -Patient to followup with GI outpt  HTN (hypertension)  -Continue metoprolol, follow  CAD  -Chest pain-free, continue aspirin Plavix and  GERD (gastroesophageal reflux disease)  -Continue PPI  Atrial fibrillation  -Continue metoprolol and ASA  Nausea vomiting -resolving, follow up urine culture- pending -continue PP for GERD Hypokalemia -replace k Deconditioning -PT/OT recommending SNF    Code Status: full Family Communication: none at bedside Disposition Plan: likely to SNF when medically ready   Consultants:  none  Procedures:  Echo   Study Conclusions  - Left ventricle: The cavity size was normal. Systolic function was normal. The estimated ejection fraction was 50%. - Mitral valve: Moderate diffuse thickening and calcification. Mild regurgitation. - Left atrium: The atrium was moderately dilated. - Right atrium: The atrium was moderately dilated. - Pulmonary arteries:   Antibiotics:  none  HPI/Subjective: No further vomiting, diarrheax1 so far today. No further SOB and decrease in leg swelling  Objective: Filed Vitals:   01/29/13 0637 01/29/13 0936 01/29/13 1329 01/29/13 2104  BP: 134/80 118/68 120/77  134/84  Pulse: 111  108 96  Temp: 97.8 F (36.6 C)  98.3 F (36.8 C) 97.7 F (36.5 C)  TempSrc: Oral  Oral Oral  Resp: 18  18 16   Height:      Weight: 52.028 kg (114 lb 11.2 oz)     SpO2: 99% 92% 100% 90%    Intake/Output Summary (Last 24 hours) at 01/29/13 2136 Last data filed at 01/29/13 2119  Gross per 24 hour  Intake    660 ml  Output   2225 ml  Net  -1565 ml   Filed Weights   01/27/13 1600 01/28/13 0631 01/29/13 0637  Weight: 55.5 kg (122 lb 5.7 oz) 53.8 kg (118 lb 9.7 oz) 52.028 kg (114 lb 11.2 oz)   Exam:  General: alert & appropriate  In NAD Cardiovascular: RRR, nl S1 s2 Respiratory: CTAB Abdomen: soft +BS NT/ND, no masses palpable Extremities: No cyanosis and trace- +1edema    Data Reviewed: Basic Metabolic Panel:  Recent Labs Lab 01/27/13 0928 01/27/13 1555 01/28/13 0544  NA 135  --  136  K 4.2  --  3.0*  CL 98  --  98  CO2 24  --  26  GLUCOSE 90  --  78  BUN 10  --  11  CREATININE 0.92 0.90 0.91  CALCIUM 9.7  --  9.0  MG 1.6  --   --    Liver Function Tests:  Recent Labs Lab 01/27/13 0928  AST 21  ALT 16  ALKPHOS 134*  BILITOT 0.8  PROT 6.2  ALBUMIN 3.4*    Recent Labs Lab 01/27/13 0928  LIPASE 24   No results found for this basename: AMMONIA,  in the last 168 hours CBC:  Recent Labs Lab  01/27/13 0928 01/27/13 1555  WBC 7.4 6.7  HGB 12.8 11.8*  HCT 38.3 35.4*  MCV 88.2 88.1  PLT 202 240   Cardiac Enzymes:  Recent Labs Lab 01/27/13 0949 01/27/13 1555 01/27/13 2150 01/28/13 0544  TROPONINI <0.30 <0.30 <0.30 <0.30   BNP (last 3 results)  Recent Labs  01/27/13 1105 01/29/13 0521  PROBNP 10541.0* 10095.0*   CBG: No results found for this basename: GLUCAP,  in the last 168 hours  Recent Results (from the past 240 hour(s))  CLOSTRIDIUM DIFFICILE BY PCR     Status: None   Collection Time    01/27/13 11:11 PM      Result Value Range Status   C difficile by pcr NEGATIVE  NEGATIVE Final     Studies: No  results found.  Scheduled Meds: . aspirin  325 mg Oral Daily  . clopidogrel  75 mg Oral Q breakfast  . diphenoxylate-atropine  2 tablet Oral TID  . enoxaparin (LOVENOX) injection  40 mg Subcutaneous Q24H  . furosemide  20 mg Intravenous BID  . gabapentin  100 mg Oral QHS  . metoprolol  100 mg Oral Daily  . pantoprazole  40 mg Oral Daily  . potassium chloride  40 mEq Oral Daily  . sodium chloride  3 mL Intravenous Q12H   Continuous Infusions:   Active Problems:   HTN (hypertension)   GERD (gastroesophageal reflux disease)   Diarrhea   Atrial fibrillation   CHF, acute    Time spent: 35    [facility]  [facility] Pager [phone]. If 7PM-7AM, please contact night-coverage at [email or web address], password [facility] 01/29/2013, 9:36 PM  LOS: 2 days

## 2013-01-30 LAB — BASIC METABOLIC PANEL
CO2: 35 mEq/L — ABNORMAL HIGH (ref 19–32)
Chloride: 95 mEq/L — ABNORMAL LOW (ref 96–112)
GFR calc Af Amer: 53 mL/min — ABNORMAL LOW (ref >90)
Potassium: 4.3 mEq/L (ref 3.5–5.1)
Sodium: 138 mEq/L (ref 135–145)

## 2013-01-30 LAB — URINE CULTURE: Colony Count: 30000

## 2013-01-30 MED ORDER — METOPROLOL TARTRATE 1 MG/ML IV SOLN
2.5000 mg | Freq: Once | INTRAVENOUS | Status: AC
  Administered 2013-01-30: 2.5 mg via INTRAVENOUS
  Filled 2013-01-30: qty 5

## 2013-01-30 MED ORDER — ENOXAPARIN SODIUM 30 MG/0.3ML ~~LOC~~ SOLN
30.0000 mg | SUBCUTANEOUS | Status: DC
  Administered 2013-01-30: 30 mg via SUBCUTANEOUS
  Filled 2013-01-30 (×2): qty 0.3

## 2013-01-30 NOTE — Progress Notes (Signed)
Pt HR sustaining between 115-130. HR up to 140 and 150 non-sustained while pt asleep. BP 110/70. Pt with no chest pain or shortness of breath. Jerilynn Mages on call tonight notified and new order for metoprolol 2.5mg  IV. Will continue to monitor pt. Baron Hamper, RN 01/30/2013

## 2013-01-30 NOTE — Progress Notes (Signed)
Pt c/o nausea, PRN Zofran given po, IV infiltrated, IV team to place new one, pt oob with assist x 1, pt ambulated with PT, tolerated well, will continue to monitor

## 2013-01-30 NOTE — Progress Notes (Signed)
Physical Therapy Treatment Patient Details Name: Lori Travis MRN: [id number] DOB: 1918/09/14 Today's Date: 01/30/2013 Time: 6962-9528 PT Time Calculation (min): 18 min  PT Assessment / Plan / Recommendation  PT Comments   Pt progressing well. Pt remains to require use of RW for safe ambulation. Pt con't to have generalized deconditioning and increased falls risk. Pt remains appropriate for ST-SNF placement to address deficits and achieve safe mod I function for safe transition back home.   Follow Up Recommendations  SNF;Supervision/Assistance - 24 hour     Does the patient have the potential to tolerate intense rehabilitation     Barriers to Discharge        Equipment Recommendations  Rolling walker with 5" wheels    Recommendations for Other Services    Frequency Min 3X/week   Progress towards PT Goals Progress towards PT goals: Progressing toward goals  Plan Current plan remains appropriate    Precautions / Restrictions Precautions Precautions: Fall Restrictions Weight Bearing Restrictions: No   Pertinent Vitals/Pain Denies pain    Mobility  Bed Mobility Bed Mobility: Supine to Sit Supine to Sit: 5: Supervision;HOB elevated Sitting - Scoot to Edge of Bed: 5: Supervision Details for Bed Mobility Assistance: pt with safe technique Transfers Transfers: Sit to Stand;Stand to Sit Sit to Stand: 4: Min guard;With upper extremity assist;From bed Stand to Sit: 4: Min guard Details for Transfer Assistance: v/c's for safe hand placement Ambulation/Gait Ambulation/Gait Assistance: 4: Min guard Ambulation Distance (Feet): 200 Feet Assistive device: Rolling walker Ambulation/Gait Assistance Details: v/c's for RW management during turning Gait Pattern: Step-through pattern;Decreased stride length Gait velocity: slow General Gait Details: amb with straight cane 20' however less stable. Pt agrees she is more stable with use of RW Stairs: No    Exercises     PT Diagnosis:     PT Problem List:   PT Treatment Interventions:     PT Goals (current goals can now be found in the care plan section)    Visit Information  Last PT Received On: 01/30/13 Assistance Needed: +1 History of Present Illness: Lori Travis is a 79 y.o. female with history of atrial fibrillation, CAD/status post MI in the past with coronary stents, CVA, hypertension who presents with above complaints. She states that for the past one week she has had worsening shortness of breath and leg swelling. She admits to orthopnea. She denies chest pain. She was discharged from the hospital 7/7 following admission for diarrhea workup which was all negative, and she states she has continued to have diarrhea 3-4 times per day since discharge. She admits to nausea, vomitingx1. In the ED a chest x-ray was done which showed interstitial edema with bilateral pleural effusion and nodular opacities noted    Subjective Data  Subjective: Pt received supine in bed agreeable to PT.   Cognition  Cognition Arousal/Alertness: Awake/alert Behavior During Therapy: WFL for tasks assessed/performed Overall Cognitive Status: Within Functional Limits for tasks assessed    Balance     End of Session PT - End of Session Equipment Utilized During Treatment: Gait belt Activity Tolerance: Patient tolerated treatment well Patient left: in chair;with call bell/phone within reach;with family/visitor present Nurse Communication: Mobility status   GP     Marcene Brawn 01/30/2013, 3:32 PM   Lewis Shock, PT, DPT Pager #: [phone] Office #: [phone]

## 2013-01-30 NOTE — Plan of Care (Signed)
Problem: Phase II Progression Outcomes Goal: Discharge plan established Outcome: Completed/Met Date Met:  01/30/13 Plan is for pt to go to SNF  Problem: Phase III Progression Outcomes Goal: Pain controlled on oral analgesia Outcome: Completed/Met Date Met:  01/30/13 Pt has not had any c/o pain

## 2013-01-30 NOTE — Progress Notes (Signed)
CSW met with patient and patient's son to give bed offers. Patient and family will discuss placement options and follow up with CSW.

## 2013-01-30 NOTE — Discharge Summary (Addendum)
Physician Discharge Summary  Lori Travis ZOX:[id number] DOB: Jun 12, 1919 DOA: 01/27/2013  PCP: Nicki Reaper, NP  Admit date: 01/27/2013 Discharge date: 01/30/2013  Time spent: 40 minutes  Recommendations for Outpatient Follow-up:  1. CHF; acute exacerbation resolved patient currently at 108lbs which is 8 pounds less than her discharge weight of 116lbs on 01/21/2013. I would use this as her new dry weight 2. HTN; slightly elevated but given her age would not try to further regulate. 3. Chronic diarrhea; will contact son in the a.m. Clyda Hurdle [phone], reviewed labs from last admission stool cultures were negative C. difficile negative, O. and P. negative. Will start on Lomotil however Patient will need a GI outpatient appointment this has been discussed at length with patient.  4. A. fib; stable rate controlled 5.   UTI; Escherichia coli, start Nitrofurantoin 100mg  BID day 2/5   Discharge Diagnoses:  Active Problems:   HTN (hypertension)   GERD (gastroesophageal reflux disease)   Diarrhea   Atrial fibrillation   CHF, acute   Discharge Condition: Stable  Diet recommendation: Heart Healthy  Filed Weights   01/28/13 0631 01/29/13 0637 01/30/13 0607  Weight: 53.8 kg (118 lb 9.7 oz) 52.028 kg (114 lb 11.2 oz) 50.848 kg (112 lb 1.6 oz)    History of present illness:  Lori Travis is a 79 y.o. female PMHx A-Fib , CAD/status post MI in the past with coronary stents, CVA,HTN, who presents with above complaints. She states that for the past one week she has had worsening shortness of breath and leg swelling. She admits to orthopnea. She denies chest pain. She was discharged from the hospital 7/7 following admission for diarrhea workup which was all negative, and she states she has continued to have diarrhea 3-4 times per day since discharge. She admits to nausea, vomiting x1. In the ED a chest x-ray was done which showed interstitial edema with bilateral pleural effusion and nodular  opacities noted. A CT angiogram was obtained and came back negative for PE small bilateral pleural effusions and probable mild interstitial edema noted. No evidence of pulmonary consolidation or mass. TODAY no longer short of breath, continues to have chronic diarrhea with stools which are semi-formed. Patient was evaluated for this approximately 2 weeks ago and son was to take patient to GI appointment. Per patient never went to GI appointment. TODAY states doing well had a small episode of semi-formed stool (diarrhea). No shortness of breath or chest discharge patient to [facility] nursing home. Patient's son has requested a meeting this morning prior to discharge to be brought up-to-date on plan of care.      Procedures:  - Left ventricle: The cavity size was normal. Systolic function was normal. The estimated ejection fraction was 50%. - Mitral valve: Moderate diffuse thickening and calcification. Mild regurgitation. - Left atrium: The atrium was moderately dilated. - Right atrium: The atrium was moderately dilated. - Pulmonary arteries: PA peak pressure: 33mm Hg (S). Transthoracic echocardiography. M-mode, complete 2D, spectral Doppler, and color Doppler. Height: Height: 162.6cm. Height: 64in. Weight: Weight: 53.5kg. Weight: 117.8lb. Body mass index: BMI: 20.3kg/m^2. Body surface area: BSA: 1.22m^2. Blood pressure: 144/80. Patient status: Inpatient. Location: Bedside.  ------------------------------------------------------------  ------------------------------------------------------------ Left ventricle: The endocardium is not well visulalized in all views. In the parasternal short axis there appears to be hypokinesis of the inferior wall The cavity size was normal. Systolic function was normal. The estimated ejection fraction was 50%. Images were inadequate for LV wall motion assessment. Normal sinus rhythm was absent.   )  Consultations:    Discharge Exam: Filed  Vitals:   01/29/13 2104 01/30/13 0102 01/30/13 0211 01/30/13 0607  BP: 134/84 110/79 109/85 128/92  Pulse: 96 134 110 118  Temp: 97.7 F (36.5 C)   97.6 F (36.4 C)  TempSrc: Oral   Oral  Resp: 16   16  Height:      Weight:    50.848 kg (112 lb 1.6 oz)  SpO2: 90% 97%  98%    General: Alert,NAD Cardiovascular: Irregular irregular rhythm and rate , negative murmurs rubs or gallops, PT/DP 2+ bilaterally  Respiratory: Clear to auscultation bilaterally  Abdomen: Soft nontender nondistended plus bowel sounds Musculoskeletal; negative pedal edema bilateral   Discharge Instructions     Medication List    ASK your doctor about these medications       aspirin 325 MG EC tablet  Take 325 mg by mouth daily.     clopidogrel 75 MG tablet  Commonly known as:  PLAVIX  Take 75 mg by mouth daily.     diphenoxylate-atropine 2.5-0.025 MG per tablet  Commonly known as:  LOMOTIL  Take 1 tablet by mouth 4 (four) times daily as needed for diarrhea or loose stools.     gabapentin 100 MG capsule  Commonly known as:  NEURONTIN  Take 100 mg by mouth at bedtime.     lansoprazole 30 MG capsule  Commonly known as:  PREVACID  Take 30 mg by mouth daily.     magnesium oxide 400 (241.3 MG) MG tablet  Commonly known as:  MAG-OX  Take 1 tablet (400 mg total) by mouth 2 (two) times daily.     metoprolol 100 MG tablet  Commonly known as:  LOPRESSOR  Take 100 mg by mouth daily.     nitroGLYCERIN 0.4 MG SL tablet  Commonly known as:  NITROSTAT  Place 0.4 mg under the tongue every 5 (five) minutes as needed for chest pain.     ondansetron 8 MG disintegrating tablet  Commonly known as:  ZOFRAN-ODT  Take 1 tablet (8 mg total) by mouth every 12 (twelve) hours as needed for nausea.     traMADol 50 MG tablet  Commonly known as:  ULTRAM  Take 100 mg by mouth every 12 (twelve) hours as needed for pain. Back pain       Allergies  Allergen Reactions  . Penicillins     unknown  . Sulfa  Antibiotics     unknown      The results of significant diagnostics from this hospitalization (including imaging, microbiology, ancillary and laboratory) are listed below for reference.    Significant Diagnostic Studies: Dg Chest 2 View  01/27/2013   *RADIOLOGY REPORT*  Clinical Data: Shortness of breath and diarrhea.  CHEST - 2 VIEW  Comparison: None.  Findings: There is evidence of interstitial edema and small bilateral pleural effusions.  Additional opacities in both lower lungs may represent infiltrates, areas of atelectasis or subtle mass lesions.  Follow-up is recommended.  The heart is mildly enlarged.  The bony thorax is unremarkable.  IMPRESSION: Interstitial edema with bilateral pleural effusions.  Additional opacities in both lower lungs present which are somewhat nodular in appearance.  These may represent areas of atelectasis, infiltrate or subtle masses.  Follow-up chest x-ray is recommended.   Original Report Authenticated By: Irish Lack, M.D.   Ct Angio Chest Pe W/cm &/or Wo Cm  01/27/2013   *RADIOLOGY REPORT*  Clinical Data: Chest pain and shortness of breath.  CT ANGIOGRAPHY  CHEST  Technique:  Multidetector CT imaging of the chest using the standard protocol during bolus administration of intravenous contrast. Multiplanar reconstructed images including MIPs were obtained and reviewed to evaluate the vascular anatomy.  Contrast: OMNIPAQUE IOHEXOL 350 MG/ML SOLN  Comparison: None.  Findings: Satisfactory opacification of the pulmonary arteries noted, and there is no evidence of pulmonary emboli.  Thoracic aorta is not well opacified on this exam, however there is no evidence of thoracic aortic aneurysm.  No evidence of mediastinal hematoma or mass.  Small pleural effusions are seen bilaterally.  Mild interstitial and central peribronchial thickening is seen, suspicious for mild interstitial edema.  No evidence of pulmonary consolidation or mass.  No evidence of central  endobronchial obstruction. Mild mediastinal lymphadenopathy in the right paratracheal region is likely reactive or secondary to lymphedema.  Moderate cardiomegaly is noted with mild right heart enlargement and reflux of IV contrast into the hepatic veins, consistent with right heart insufficiency.  IMPRESSION:  1.  No evidence of pulmonary embolism. 2.  Small bilateral pleural effusions and probable mild interstitial edema.  No evidence of pulmonary consolidation or mass. 3.  Cardiomegaly and right heart insufficiency.   Original Report Authenticated By: Myles Rosenthal, M.D.    Microbiology: Recent Results (from the past 240 hour(s))  CLOSTRIDIUM DIFFICILE BY PCR     Status: None   Collection Time    01/27/13 11:11 PM      Result Value Range Status   C difficile by pcr NEGATIVE  NEGATIVE Final  URINE CULTURE     Status: None   Collection Time    01/29/13  2:51 AM      Result Value Range Status   Specimen Description URINE, CLEAN CATCH   Final   Special Requests NONE   Final   Culture  Setup Time 01/29/2013 09:20   Final   Colony Count 30,000 COLONIES/ML   Final   Culture ESCHERICHIA COLI   Final   Report Status PENDING   Incomplete     Labs: Basic Metabolic Panel:  Recent Labs Lab 01/27/13 0928 01/27/13 1555 01/28/13 0544 01/30/13 0700  NA 135  --  136 138  K 4.2  --  3.0* 4.3  CL 98  --  98 95*  CO2 24  --  26 35*  GLUCOSE 90  --  78 94  BUN 10  --  11 13  CREATININE 0.92 0.90 0.91 1.03  CALCIUM 9.7  --  9.0 9.3  MG 1.6  --   --   --    Liver Function Tests:  Recent Labs Lab 01/27/13 0928  AST 21  ALT 16  ALKPHOS 134*  BILITOT 0.8  PROT 6.2  ALBUMIN 3.4*    Recent Labs Lab 01/27/13 0928  LIPASE 24   No results found for this basename: AMMONIA,  in the last 168 hours CBC:  Recent Labs Lab 01/27/13 0928 01/27/13 1555  WBC 7.4 6.7  HGB 12.8 11.8*  HCT 38.3 35.4*  MCV 88.2 88.1  PLT 202 240   Cardiac Enzymes:  Recent Labs Lab 01/27/13 0949  01/27/13 1555 01/27/13 2150 01/28/13 0544  TROPONINI <0.30 <0.30 <0.30 <0.30   BNP: BNP (last 3 results)  Recent Labs  01/27/13 1105 01/29/13 0521  PROBNP 10541.0* 10095.0*   CBG: No results found for this basename: GLUCAP,  in the last 168 hours     Signed:  Carolyne Littles, J  [facility] 01/30/2013, 8:56 AM

## 2013-01-31 MED ORDER — FUROSEMIDE 20 MG PO TABS: 20.0000 mg | ORAL_TABLET | Freq: Every day | ORAL | Status: DC

## 2013-01-31 MED ORDER — NITROFURANTOIN MONOHYD MACRO 100 MG PO CAPS
100.0000 mg | ORAL_CAPSULE | Freq: Two times a day (BID) | ORAL | Status: DC
  Administered 2013-01-31 (×2): 100 mg via ORAL
  Filled 2013-01-31 (×3): qty 1

## 2013-01-31 MED ORDER — DIPHENOXYLATE-ATROPINE 2.5-0.025 MG PO TABS: 2.0000 | ORAL_TABLET | Freq: Two times a day (BID) | ORAL | Status: DC

## 2013-01-31 MED ORDER — DIPHENOXYLATE-ATROPINE 2.5-0.025 MG PO TABS: 2.0000 | ORAL_TABLET | Freq: Four times a day (QID) | ORAL | Status: DC | PRN

## 2013-01-31 NOTE — Progress Notes (Signed)
Dr. Joseph Art notified of urine culture result. MD new order for ABX. Baron Hamper, RN 01/31/2013

## 2013-01-31 NOTE — Plan of Care (Signed)
Problem: Phase II Progression Outcomes Goal: Progress activity as tolerated unless otherwise ordered Outcome: Completed/Met Date Met:  01/31/13 Pt OOB to bathroom with 1 asst  Problem: Phase III Progression Outcomes Goal: IV/normal saline lock discontinued Outcome: Completed/Met Date Met:  01/31/13 Pt with no fluids running in IV

## 2013-01-31 NOTE — Progress Notes (Signed)
Pt d/c to [facility] via ambulance and repot called to Marisue Ivan, nurse at the facility.  Amanda Pea, Charity fundraiser.

## 2013-01-31 NOTE — Progress Notes (Signed)
Physical Therapy Treatment Patient Details Name: Lori Travis MRN: [id number] DOB: 07-16-1919 Today's Date: 01/31/2013 Time: 4696-2952 PT Time Calculation (min): 24 min  PT Assessment / Plan / Recommendation  PT Comments   Pt able to increase ambulation distance and tolerated HEP.   Continue to recommend SNF for further therapy prior to d/c home.   Follow Up Recommendations  SNF;Supervision/Assistance - 24 hour     Equipment Recommendations  Rolling walker with 5" wheels    Recommendations for Other Services    Frequency Min 3X/week   Progress towards PT Goals Progress towards PT goals: Progressing toward goals  Plan Current plan remains appropriate    Precautions / Restrictions Precautions Precautions: Fall Restrictions Weight Bearing Restrictions: No   Pertinent Vitals/Pain No c/o pain    Mobility  Bed Mobility Bed Mobility: Not assessed Transfers Transfers: Sit to Stand;Stand to Sit Sit to Stand: 4: Min guard;With upper extremity assist;From bed Stand to Sit: 4: Min guard Details for Transfer Assistance: v/c's for safe hand placement Ambulation/Gait Ambulation/Gait Assistance: 4: Min guard Ambulation Distance (Feet): 240 Feet Assistive device: Rolling walker Ambulation/Gait Assistance Details: v/c's for RW management during turning Gait Pattern: Step-through pattern;Decreased stride length Gait velocity: slow Stairs: No    Exercises General Exercises - Lower Extremity Long Arc Quad: Strengthening;Both;10 reps Hip Flexion/Marching: Strengthening;Both;10 reps;Seated Toe Raises: Strengthening;Both;10 reps   PT Diagnosis:    PT Problem List:   PT Treatment Interventions:     PT Goals (current goals can now be found in the care plan section) Acute Rehab PT Goals Patient Stated Goal: to get stronger PT Goal Formulation: With patient/family Time For Goal Achievement: 02/11/13 Potential to Achieve Goals: Good  Visit Information  Last PT Received On:  01/31/13 Assistance Needed: +1 History of Present Illness: Lori Travis is a 79 y.o. female with history of atrial fibrillation, CAD/status post MI in the past with coronary stents, CVA, hypertension who presents with above complaints. She states that for the past one week she has had worsening shortness of breath and leg swelling. She admits to orthopnea. She denies chest pain. She was discharged from the hospital 7/7 following admission for diarrhea workup which was all negative, and she states she has continued to have diarrhea 3-4 times per day since discharge. She admits to nausea, vomitingx1. In the ED a chest x-ray was done which showed interstitial edema with bilateral pleural effusion and nodular opacities noted    Subjective Data  Subjective: Pt received supine in bed agreeable to PT. Patient Stated Goal: to get stronger   Cognition  Cognition Arousal/Alertness: Awake/alert Behavior During Therapy: WFL for tasks assessed/performed Overall Cognitive Status: Within Functional Limits for tasks assessed    Balance     End of Session PT - End of Session Equipment Utilized During Treatment: Gait belt Activity Tolerance: Patient tolerated treatment well Patient left: in chair;with call bell/phone within reach;with family/visitor present Nurse Communication: Mobility status   GP     Lori Travis 01/31/2013, 1:15 PM  Jake Shark, PT DPT [phone]

## 2013-02-01 ENCOUNTER — Telehealth: Payer: Self-pay | Admitting: Internal Medicine

## 2013-02-01 ENCOUNTER — Non-Acute Institutional Stay (SKILLED_NURSING_FACILITY): Payer: Medicare Other | Admitting: Internal Medicine

## 2013-02-01 DIAGNOSIS — I69959 Hemiplegia and hemiparesis following unspecified cerebrovascular disease affecting unspecified side: Secondary | ICD-10-CM

## 2013-02-01 DIAGNOSIS — R197 Diarrhea, unspecified: Secondary | ICD-10-CM

## 2013-02-01 DIAGNOSIS — R269 Unspecified abnormalities of gait and mobility: Secondary | ICD-10-CM

## 2013-02-01 DIAGNOSIS — I509 Heart failure, unspecified: Secondary | ICD-10-CM

## 2013-02-01 DIAGNOSIS — E876 Hypokalemia: Secondary | ICD-10-CM

## 2013-02-01 NOTE — Telephone Encounter (Signed)
Lori Travis has been put into a nursing home.  Her son called.  He thought she was going to be referred to GI.  When he called the GI dept they told him they aren't taking new patients.  He is requesting a referral.

## 2013-02-01 NOTE — Progress Notes (Signed)
Patient ID: Lori Travis, female   DOB: June 14, 1919, 79 y.o.   MRN: [id number] Facility; [facility] Chief complaint; admission to SNF psot stay at [facility] 01/27/2013 through 01/31/2013 History; this is a 79 year old woman, who gives [place] an interesting history of problems dating back to April of this year. She tells me that she was in [facility] suffering a stroke. She was sent to inpatient rehabilitation and did well. She was home for roughly a month according to her functioning reasonably independently when she started to develop loose stools/diarrhea. The exact history here is somewhat difficult from the patient, however, she tells me she would have 4-5, mostly liquid bowel movements a day. This was not associated with abdominal pain, nausea, vomiting, melena, or hematochezia. She did have anorexia, lost 13 pounds in roughly a 6 week period. This was significant enough that she developed refractory hypokalemia, and there are records to this effect in [place] link. She has been admitted for IV fluids and potassium replacement.  On this occasion the patient was admitted for worsening shortness of breath and leg swelling. She was in hospital on July 7 for diarrhea. Workup, which was all negative. She noted continuous diarrhea on admission, the hospital. This time, and one episode of vomiting. She had a CT angiogram that was negative for PE but did suggest mild interstitial edema. She was admitted to hospital with a potassium of 3.2. The next day it was 3.0 on 7/14 her K+ 4.2. Brownian level is 2.9 on 77. Lipase was normal. CBC was quite normal. Her pro BNP was 10,541. Terms of her diarrhea. Negative workup. She had 2 negative C. difficile toxins. Stool culture was negative from the other usual pathogens. OVA and parasites were negative. In terms of her cardiac workup. She had an echocardiogram that showed normal systolic function with an EF of 50%. There was hypokinesis of the inferior wall.  There is  a CT scan of the abdomen and pelvis in [place] link, which I think was ordered by Dr. 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 Further, she had lytic lesions at L1 and L3-1 centimeter in size or not further characterized.   Medications; ASA 325 daily, Plavix, 75 daily, lomitil 1 tablet 4 times daily when necessary for diarrhea. However, she has not use this. Gabapentin 100 mg each bedtime, Prevacid, 30 mg daily, magnesium oxide 400 twice a day, metoprolol 100 by mouth daily, nitroglycerin when necessary, and Vasocon 8 mg every 12 hours when necessary, tramadol 100 every 12 when necessary  family history includes Cancer in her mother; Heart disease in her other; and Hypertension in her other.  There is no history of Stroke.   reports that she has quit smoking. She has never used smokeless tobacco. She reports that she does not drink alcohol or use illicit drugs. Further, she lives in a trailer in [place] on her own. She was using a walker. I am not exactly sure of her best functional level of late. She tells me her plan is to go home with her son. Further, she is not happy with any of her physicians in [facility], and wishes to transfer her care. Both primary and consultative the towards the [facility] system.  Review of systems Respiratory no cough or shortness of breath. Cardiac no chest pain. No exertional chest  symptoms. GI as of the last 24-48 hours. She does not describe any diarrhea, particularly noting that she has not taken any Lomitil.  GU no dysuria. Neurologic status. Her original stroke in April. Left her with left-sided weakness and gait imbalance.  Physical exam. Pulse 88, respirations 18. General; pleasant, articulate, 79 year old woman, in no distress. HEENT oral exam is normal. No lesions are seen. Mucous  membranes are moist. Neck no cervical, clavicular, or axillary adenopathy. Thyroid is not palpable. Respiratory clear entry bilaterally. Cardiac heart sounds are normal. No murmurs. No elevated jugular venous pressure, and no edema. No gallops. Abdomen abdomen is not distended. Bowel sounds are positive. There is no liver no spleen, and no tenderness. GU bladder is non-enlarged. There is no costovertebral tenderness. Neurologic; cranial nerves. She has a left homonymous hemianopsia, tongue deviates to the right otherwise, cranial nerves are normal. Motor generally weak, somewhat worse on the left, however, she has a right pronator drift, which is mild. She is diffusely hyporeflexic. Right plantar is clearly flexor, left is equivocal to extensor. Gait marked retropulsion wide-based gait. Mental status; although her history giving is a bit off, vs. what I was able to gather on the computer system, she seemed to be quite articulate and able to give a large amount of her history I think she is cognitively intact.  Impression/plan #1 diarrhea. Apparently, over the last 2 months. As far as I can followup the workup is up to date. She has had a negative C. difficile at least twice, stool for culture was negative for the usual pathogens, ova and parasites were negative. The most worrisome thing here is the CT scan from June 18, which suggested a soft tissue mass at the ampulla. No pancreatic mass was seen. No liver mass. There is also concern about lytic areas on L1, L3, although the patient doesn't appear to be particularly symptomatic from this. Clearly, the patient is going to need to see a gastroenterologist., I am doubtful that further evaluating the ampullary mass would be easy to do in this 79 year old woman. One would wonder if this is a vasoactive tumor. I think she would probably need an endoscopy and a colonoscopy from a pure medical point of view. #2 congestive heart failure; I see very little of this  at the bedside, in fact, she looks volume contracted to me. She is not on diuretics. #3 atrial fibrillation rate control on aspirin not on anticoagulants. #4 recurrent hypokalemia, and I guess, hypomagnesemia, since she is on replacement. I am concerned that the volume of diarrhea necessary to cause this would actually be quite large. Nevertheless, the patient is not having any diarrhea over the last 48 hours. Since she is in the facility. I am going to ask them to monitor the diarrhea meticulously. I am going to stop the Lomitil.  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 This admission was done in [facility]. #6 question a component of peripheral neuropathy  I will refer her to a local gastroenterologists apparently at the request of the patient. Gathering information here is not going to be easy will attempt this. As mentioned Lomitl will stop in order to have a better idea about the diarrhea, frequency. Lab work will be repeated on Monday. Including a comprehensive metabolic panel, TSH, magnesium level, and B12 , and hemoglobin A1c.

## 2013-02-01 NOTE — Progress Notes (Signed)
Date: 02/01/2013

## 2013-02-01 NOTE — Telephone Encounter (Signed)
The referral has been placed. We are working on scheduling that. We will call him when it is set up.

## 2013-02-01 NOTE — Telephone Encounter (Signed)
Pt's son informed of referral.

## 2013-02-04 ENCOUNTER — Other Ambulatory Visit: Payer: Self-pay | Admitting: Internal Medicine

## 2013-02-04 ENCOUNTER — Telehealth: Payer: Self-pay | Admitting: *Deleted

## 2013-02-04 DIAGNOSIS — K529 Noninfective gastroenteritis and colitis, unspecified: Secondary | ICD-10-CM

## 2013-02-04 NOTE — Telephone Encounter (Signed)
Pts son called states pt was to be referred to GI however they havent heard anything.  There is not a referral in the system for pt.  Please advise

## 2013-02-04 NOTE — Telephone Encounter (Signed)
Discussed with pt's son.  

## 2013-02-04 NOTE — Telephone Encounter (Signed)
The hospital said they had referred her. I did not see it in the system, so I went ahead and put it in.

## 2013-02-06 NOTE — Clinical Social Work Placement (Addendum)
    Clinical Social Work Department CLINICAL SOCIAL WORK PLACEMENT NOTE 02/06/2013  Patient:  Lori Travis,Lori Travis  Account Number:  [id number] Admit date:  01/27/2013  Clinical Social Worker:  Lupita Leash Rod Majerus, LCSWA  Date/time:  01/30/2013 01:30 PM  Clinical Social Work is seeking post-discharge placement for this patient at the following level of care:   SKILLED NURSING   (*CSW will update this form in Epic as items are completed)   01/30/2013  Patient/family provided with [facility] Department of Clinical Social Work's list of facilities offering this level of care within the geographic area requested by the patient (or if unable, by the patient's family).  01/30/2013  Patient/family informed of their freedom to choose among providers that offer the needed level of care, that participate in Medicare, Medicaid or managed care program needed by the patient, have an available bed and are willing to accept the patient.  01/30/2013  Patient/family informed of [facility]' ownership interest in [facility], as well as of the fact that they are under no obligation to receive care at this facility.  PASARR submitted to EDS on  PASARR number received from EDS on   FL2 transmitted to all facilities in geographic area requested by pt/family on  01/30/2013 FL2 transmitted to all facilities within larger geographic area on   Patient informed that his/her managed care company has contracts with or will negotiate with  certain facilities, including the following:   [facility]- Medicare Complete     Patient/family informed of bed offers received:  01/31/2013 Patient chooses bed at [facility] Physician recommends and patient chooses bed at    Patient to be transferred to [facility] on  01/31/2013 Patient to be transferred to facility by Ambulance  [facility])  The following physician request were entered in Epic:   Additional Comments: DC to [facility]  01/31/13.  Patient and son are pleased with d/c plan.  Notified SNF and pt's nurse of d/c- nurse will give report. No further CSW needs identified.  CSW signing off. Lorri Frederick. Jaci Lazier, LCSWA

## 2013-02-06 NOTE — Clinical Social Work Psychosocial (Addendum)
    Clinical Social Work Department BRIEF PSYCHOSOCIAL ASSESSMENT 02/06/2013  Patient:  Lori Travis,Lori Travis     Account Number:  [id number]     Admit date:  01/27/2013  Clinical Social Worker:  Tiburcio Pea  Date/Time:  01/30/2013 01:00 PM  Referred by:  Physician  Date Referred:  01/29/2013 Referred for  SNF Placement   Other Referral:   Interview type:  Other - See comment Other interview type:   Pateint and son    PSYCHOSOCIAL DATA Living Status:  WITH ADULT CHILDREN Admitted from facility:   Level of care:   Primary support name:  Vilma Prader  (c) [phone] Primary support relationship to patient:  CHILD, ADULT Degree of support available:   Strong support    CURRENT CONCERNS Current Concerns  Post-Acute Placement   Other Concerns:    SOCIAL WORK ASSESSMENT / PLAN 79 year old female- lives at home with her son Bil. Physical Therapy is recommending short term SNF. At first, patient was not agreeable but after talking to her son she is now agreeable.  CSW discussed bed search process and she is agreeable to search. Patient and son are requesting [facility] if possible as she has been a patient there in the past.  CSW spoke with [place]- Admissions who stated that she will review referral and anticipates an available bed. Fl2 placed on chart for MD's signature.   Assessment/plan status:  Psychosocial Support/Ongoing Assessment of Needs Other assessment/ plan:   Information/referral to community resources:   SNF list given to patient and son    PATIENT'S/FAMILY'S RESPONSE TO PLAN OF CARE: Pateint is alert, oriented and pleasant. She is agreeable to SNF placement for rehab as is her son.  Patient is familiar with SNF placement as she has been in rehab before and stated that she had a positive experience. Her son is supportive and invovled.  CSW will assist with placement process.  Lorri Frederick. Peggyann Zwiefelhofer, LCSWA  [phone]

## 2013-02-21 ENCOUNTER — Encounter: Payer: Self-pay | Admitting: Internal Medicine

## 2013-02-21 ENCOUNTER — Ambulatory Visit (INDEPENDENT_AMBULATORY_CARE_PROVIDER_SITE_OTHER): Payer: Medicare Other | Admitting: Internal Medicine

## 2013-02-21 VITALS — BP 110/60 | HR 58 | Temp 97.4°F | Wt 108.1 lb

## 2013-02-21 DIAGNOSIS — E876 Hypokalemia: Secondary | ICD-10-CM

## 2013-02-21 DIAGNOSIS — R197 Diarrhea, unspecified: Secondary | ICD-10-CM

## 2013-02-21 DIAGNOSIS — I509 Heart failure, unspecified: Secondary | ICD-10-CM

## 2013-02-21 DIAGNOSIS — E860 Dehydration: Secondary | ICD-10-CM

## 2013-02-21 MED ORDER — FUROSEMIDE 20 MG PO TABS: 20.0000 mg | ORAL_TABLET | Freq: Every day | ORAL | Status: DC

## 2013-02-21 MED ORDER — DIPHENOXYLATE-ATROPINE 2.5-0.025 MG PO TABS: 2.0000 | ORAL_TABLET | Freq: Four times a day (QID) | ORAL | Status: DC | PRN

## 2013-02-21 NOTE — Assessment & Plan Note (Signed)
Refilled lasix today Monitor weight Low sodium diet

## 2013-02-21 NOTE — Assessment & Plan Note (Signed)
Much better after replacement

## 2013-02-21 NOTE — Assessment & Plan Note (Signed)
Resolved Will refill lomotil in case it returns If returns, follow up with your GI doctor

## 2013-02-21 NOTE — Patient Instructions (Signed)
Diarrhea Diarrhea is frequent loose and watery bowel movements. It can cause you to feel weak and dehydrated. Dehydration can cause you to become tired and thirsty, have a dry mouth, and have decreased urination that often is dark yellow. Diarrhea is a sign of another problem, most often an infection that will not last long. In most cases, diarrhea typically lasts 2 3 days. However, it can last longer if it is a sign of something more serious. It is important to treat your diarrhea as directed by your caregive to lessen or prevent future episodes of diarrhea. CAUSES  Some common causes include:  Gastrointestinal infections caused by viruses, bacteria, or parasites.  Food poisoning or food allergies.  Certain medicines, such as antibiotics, chemotherapy, and laxatives.  Artificial sweeteners and fructose.  Digestive disorders. HOME CARE INSTRUCTIONS  Ensure adequate fluid intake (hydration): have 1 cup (8 oz) of fluid for each diarrhea episode. Avoid fluids that contain simple sugars or sports drinks, fruit juices, whole milk products, and sodas. Your urine should be clear or pale yellow if you are drinking enough fluids. Hydrate with an oral rehydration solution that you can purchase at pharmacies, retail stores, and online. You can prepare an oral rehydration solution at home by mixing the following ingredients together:    tsp table salt.   tsp baking soda.   tsp salt substitute containing potassium chloride.  1  tablespoons sugar.  1 L (34 oz) of water.  Certain foods and beverages may increase the speed at which food moves through the gastrointestinal (GI) tract. These foods and beverages should be avoided and include:  Caffeinated and alcoholic beverages.  High-fiber foods, such as raw fruits and vegetables, nuts, seeds, and whole grain breads and cereals.  Foods and beverages sweetened with sugar alcohols, such as xylitol, sorbitol, and mannitol.  Some foods may be well  tolerated and may help thicken stool including:  Starchy foods, such as rice, toast, pasta, low-sugar cereal, oatmeal, grits, baked potatoes, crackers, and bagels.  Bananas.  Applesauce.  Add probiotic-rich foods to help increase healthy bacteria in the GI tract, such as yogurt and fermented milk products.  Wash your hands well after each diarrhea episode.  Only take over-the-counter or prescription medicines as directed by your caregiver.  Take a warm bath to relieve any burning or pain from frequent diarrhea episodes. SEEK IMMEDIATE MEDICAL CARE IF:   You are unable to keep fluids down.  You have persistent vomiting.  You have blood in your stool, or your stools are black and tarry.  You do not urinate in 6 8 hours, or there is only a small amount of very dark urine.  You have abdominal pain that increases or localizes.  You have weakness, dizziness, confusion, or lightheadedness.  You have a severe headache.  Your diarrhea gets worse or does not get better.  You have a fever or persistent symptoms for more than 2 3 days.  You have a fever and your symptoms suddenly get worse. MAKE SURE YOU:   Understand these instructions.  Will watch your condition.  Will get help right away if you are not doing well or get worse. Document Released: 06/24/2002 Document Revised: 06/20/2012 Document Reviewed: 03/11/2012 [employer] Patient Information 2014 [employer].  

## 2013-02-21 NOTE — Progress Notes (Signed)
Subjective:    Patient ID: Lori Travis, female    DOB: 01-17-1919, 79 y.o.   MRN: [id number]  HPI  Pt presents to the clinic today for her hospital follow up. She was admitted on 01/27/2013 for dehydration, hypokalemia, diarrhea and CHF. She was discharged 4 days later. She did stay in a SNF for 2 days to monitor her diarrhea, energy levels and appetite prior to going home. Since at home, the diarrhea has resolved. She denies blood in her stools, chest pain, chest tightness or shortness of breath. She is tolerating the lasix well but she will need a refill on that as well as the lomotil. Additionally, she did see the GI doctor. 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 MRI was performed for better visualization and everything came back normal. The only thing they visualized was scarring at the ampulla.  Review of Systems      Past Medical History  Diagnosis Date  . History of chicken pox   . Allergy   . Heart disease   . Hypertension   . Stroke   . Atrial fibrillation   . MI (myocardial infarction)   . Stented coronary artery     Current Outpatient Prescriptions  Medication Sig Dispense Refill  . aspirin 325 MG EC tablet Take 325 mg by mouth daily.      . clopidogrel (PLAVIX) 75 MG tablet Take 75 mg by mouth daily.      . diphenoxylate-atropine (LOMOTIL) 2.5-0.025 MG per tablet Take 2 tablets by mouth 4 (four) times daily as needed for diarrhea or loose stools.  90 tablet  0  . furosemide (LASIX) 20 MG tablet Take 1 tablet (20 mg total) by mouth daily.  90 tablet  1  . gabapentin (NEURONTIN) 100 MG capsule Take 100 mg by mouth at bedtime.      . lansoprazole (PREVACID) 30 MG capsule Take 30 mg by mouth daily.      . metoprolol (LOPRESSOR) 100 MG tablet Take 100 mg by mouth daily.      . nitroGLYCERIN (NITROSTAT) 0.4 MG SL tablet Place 0.4 mg under the tongue every 5 (five) minutes as needed for chest pain.      Marland Kitchen  ondansetron (ZOFRAN-ODT) 8 MG disintegrating tablet Take 1 tablet (8 mg total) by mouth every 12 (twelve) hours as needed for nausea.  20 tablet  0  . traMADol (ULTRAM) 50 MG tablet Take 100 mg by mouth every 12 (twelve) hours as needed for pain. Back pain       No current facility-administered medications for this visit.    Allergies  Allergen Reactions  . Penicillins     unknown  . Sulfa Antibiotics     unknown    Family History  Problem Relation Age of Onset  . Heart disease Other     Parent, Grandparent  . Hypertension Other     Parent, Grandparent  . Cancer Mother   . Stroke Neg Hx     History   Social History  . Marital Status: Widowed    Spouse Name: N/A    Number of Children: N/A  . Years of Education: 12   Occupational History  . Retired    Social History Main Topics  . Smoking status: Former Games developer  . Smokeless tobacco: Never Used  . Alcohol Use: No  . Drug Use: No  . Sexually Active: Not on file   Other Topics Concern  .  Not on file   Social History Narrative   Regular exercise-yes   Caffeine Use-yes     Constitutional: Denies fever, malaise, fatigue, headache or abrupt weight changes.  Respiratory: Denies difficulty breathing, shortness of breath, cough or sputum production.   Cardiovascular: Denies chest pain, chest tightness, palpitations or swelling in the hands or feet.  Gastrointestinal: Denies abdominal pain, bloating, constipation, diarrhea or blood in the stool.  Neurological: Denies dizziness, difficulty with memory, difficulty with speech or problems with balance and coordination.   No other specific complaints in a complete review of systems (except as listed in HPI above).  Objective:   Physical Exam  BP 110/60  Pulse 58  Temp(Src) 97.4 F (36.3 C) (Oral)  Wt 108 lb 1.9 oz (49.043 kg)  BMI 18.55 kg/m2  SpO2 94% Wt Readings from Last 3 Encounters:  02/21/13 108 lb 1.9 oz (49.043 kg)  01/31/13 108 lb 12.8 oz (49.351 kg)   01/23/13 122 lb 12.8 oz (55.702 kg)    General: Appears her stated age, well developed, well nourished in NAD.  Cardiovascular: Normal rate and rhythm. S1,S2 noted.  No murmur, rubs or gallops noted. No JVD or BLE edema. No carotid bruits noted. Pulmonary/Chest: Normal effort and positive vesicular breath sounds. No respiratory distress. No wheezes, rales or ronchi noted.  Abdomen: Soft and nontender. Normal bowel sounds, no bruits noted. No distention or masses noted. Liver, spleen and kidneys non palpable. Neurological: Alert and oriented. Cranial nerves II-XII intact. Coordination normal. +DTRs bilaterally.   BMET    Component Value Date/Time   NA 138 01/30/2013 0700   K 4.3 01/30/2013 0700   CL 95* 01/30/2013 0700   CO2 35* 01/30/2013 0700   GLUCOSE 94 01/30/2013 0700   BUN 13 01/30/2013 0700   CREATININE 1.03 01/30/2013 0700   CALCIUM 9.3 01/30/2013 0700   GFRNONAA 46* 01/30/2013 0700   GFRAA 53* 01/30/2013 0700    Lipid Panel  No results found for this basename: chol, trig, hdl, cholhdl, vldl, ldlcalc    CBC    Component Value Date/Time   WBC 6.7 01/27/2013 1555   RBC 4.02 01/27/2013 1555   HGB 11.8* 01/27/2013 1555   HCT 35.4* 01/27/2013 1555   PLT 240 01/27/2013 1555   MCV 88.1 01/27/2013 1555   MCH 29.4 01/27/2013 1555   MCHC 33.3 01/27/2013 1555   RDW 13.9 01/27/2013 1555   LYMPHSABS 1.7 01/19/2013 1803   MONOABS 0.8 01/19/2013 1803   EOSABS 0.6 01/19/2013 1803   BASOSABS 0.1 01/19/2013 1803    Hgb A1C No results found for this basename: HGBA1C         Assessment & Plan:   Hospital f/u for diarrhea, dehydration, hypokalemia and CHF:  Overall doing well Continue to stay well hydrated and monitor weights Refilled lomotil and lasix. If diarrhea returns, make f/u appointment with your GI doctor  Will see you back i n3 months

## 2013-03-14 ENCOUNTER — Other Ambulatory Visit: Payer: Self-pay | Admitting: *Deleted

## 2013-03-14 MED ORDER — METOPROLOL TARTRATE 100 MG PO TABS: 100.0000 mg | ORAL_TABLET | Freq: Every day | ORAL | Status: DC

## 2013-03-20 ENCOUNTER — Other Ambulatory Visit: Payer: Self-pay

## 2013-03-20 DIAGNOSIS — I509 Heart failure, unspecified: Secondary | ICD-10-CM

## 2013-03-20 DIAGNOSIS — R569 Unspecified convulsions: Secondary | ICD-10-CM

## 2013-03-20 MED ORDER — CLOPIDOGREL BISULFATE 75 MG PO TABS: 75.0000 mg | ORAL_TABLET | Freq: Every day | ORAL | Status: DC

## 2013-03-20 MED ORDER — GABAPENTIN 100 MG PO CAPS: 100.0000 mg | ORAL_CAPSULE | Freq: Every day | ORAL | Status: DC

## 2013-03-20 NOTE — Telephone Encounter (Signed)
Refill request for clopidogrel...ds,cma

## 2013-03-20 NOTE — Telephone Encounter (Signed)
Fax from pharmacy for Gabapentin, you have not yet filled this for patient, request for continuance of therapy...ds,cma

## 2013-04-10 ENCOUNTER — Ambulatory Visit (INDEPENDENT_AMBULATORY_CARE_PROVIDER_SITE_OTHER): Payer: Medicare Other

## 2013-04-10 DIAGNOSIS — Z23 Encounter for immunization: Secondary | ICD-10-CM

## 2013-06-21 ENCOUNTER — Other Ambulatory Visit: Payer: Self-pay | Admitting: *Deleted

## 2013-06-21 DIAGNOSIS — R569 Unspecified convulsions: Secondary | ICD-10-CM

## 2013-06-21 MED ORDER — GABAPENTIN 100 MG PO CAPS: 100.0000 mg | ORAL_CAPSULE | Freq: Every day | ORAL | Status: DC

## 2013-06-21 NOTE — Telephone Encounter (Signed)
Last office visit 02/21/2013.  Regina Baity's Patient.  Ok to refill?

## 2013-07-25 ENCOUNTER — Other Ambulatory Visit: Payer: Self-pay | Admitting: *Deleted

## 2013-07-25 DIAGNOSIS — R569 Unspecified convulsions: Secondary | ICD-10-CM

## 2013-07-25 NOTE — Telephone Encounter (Signed)
Last office visit 02/21/2013.  Ok to refill? 

## 2013-07-26 ENCOUNTER — Other Ambulatory Visit: Payer: Self-pay

## 2013-07-26 DIAGNOSIS — I509 Heart failure, unspecified: Secondary | ICD-10-CM

## 2013-07-26 MED ORDER — GABAPENTIN 100 MG PO CAPS: 100.0000 mg | ORAL_CAPSULE | Freq: Every day | ORAL | Status: DC

## 2013-07-26 NOTE — Telephone Encounter (Signed)
Last filled 03/20/13 with 2 refills--please advise

## 2013-07-29 MED ORDER — CLOPIDOGREL BISULFATE 75 MG PO TABS: 75.0000 mg | ORAL_TABLET | Freq: Every day | ORAL | Status: DC

## 2013-08-20 ENCOUNTER — Other Ambulatory Visit: Payer: Self-pay | Admitting: Internal Medicine

## 2013-08-20 NOTE — Telephone Encounter (Signed)
Last filled 07/25/13--please advise 

## 2013-09-11 ENCOUNTER — Other Ambulatory Visit: Payer: Self-pay

## 2013-09-11 MED ORDER — FUROSEMIDE 20 MG PO TABS: 20.0000 mg | ORAL_TABLET | Freq: Every day | ORAL | Status: DC

## 2013-09-17 ENCOUNTER — Other Ambulatory Visit: Payer: Self-pay

## 2013-09-17 NOTE — Telephone Encounter (Signed)
Last filled 01/2013--last OV 8/14--please advise

## 2013-09-24 ENCOUNTER — Other Ambulatory Visit: Payer: Self-pay

## 2013-09-24 MED ORDER — TRAMADOL HCL 50 MG PO TABS: 100.0000 mg | ORAL_TABLET | Freq: Two times a day (BID) | ORAL | Status: DC | PRN

## 2013-09-24 NOTE — Telephone Encounter (Signed)
Rx called into pharmacy Pt's son Rush Landmark is aware

## 2013-09-24 NOTE — Telephone Encounter (Signed)
Ok to phone in tramadol 

## 2013-09-24 NOTE — Telephone Encounter (Signed)
Bill pts son said pt needs refill of tramadol for back pain due to osteoporosis and has been previously filled by another dr that pt no longer sees and Bill request refill to [facility]. Bill said too far for pt to come to [place] will call to have pt reassigned to another doctor at [place] or [place]. Bill request cb.

## 2013-10-13 ENCOUNTER — Other Ambulatory Visit: Payer: Self-pay | Admitting: Internal Medicine

## 2013-11-07 ENCOUNTER — Other Ambulatory Visit: Payer: Self-pay | Admitting: Internal Medicine

## 2013-11-08 NOTE — Telephone Encounter (Signed)
Pt last OV with you was 8/14--please advise if okay to refill

## 2013-11-08 NOTE — Telephone Encounter (Signed)
Ok to phone in tramadol 

## 2013-11-08 NOTE — Telephone Encounter (Signed)
Rx called in to pharmacy. 

## 2013-11-20 ENCOUNTER — Other Ambulatory Visit: Payer: Self-pay

## 2013-11-20 ENCOUNTER — Other Ambulatory Visit: Payer: Self-pay | Admitting: Internal Medicine

## 2013-11-20 MED ORDER — TRAMADOL HCL 50 MG PO TABS: 50.0000 mg | ORAL_TABLET | Freq: Two times a day (BID) | ORAL | Status: DC

## 2013-11-20 NOTE — Telephone Encounter (Signed)
Tramadol called into pharmacy per request from Webb Silversmith, NP-C

## 2013-11-25 ENCOUNTER — Other Ambulatory Visit: Payer: Self-pay | Admitting: *Deleted

## 2013-11-25 MED ORDER — GABAPENTIN 100 MG PO CAPS: ORAL_CAPSULE | ORAL | Status: DC

## 2013-12-13 ENCOUNTER — Ambulatory Visit (INDEPENDENT_AMBULATORY_CARE_PROVIDER_SITE_OTHER): Payer: Medicare Other | Admitting: Internal Medicine

## 2013-12-13 ENCOUNTER — Other Ambulatory Visit (INDEPENDENT_AMBULATORY_CARE_PROVIDER_SITE_OTHER): Payer: Medicare Other

## 2013-12-13 ENCOUNTER — Encounter: Payer: Self-pay | Admitting: Internal Medicine

## 2013-12-13 VITALS — BP 126/78 | HR 72 | Temp 97.4°F | Resp 16 | Wt 95.0 lb

## 2013-12-13 DIAGNOSIS — M545 Low back pain, unspecified: Secondary | ICD-10-CM

## 2013-12-13 DIAGNOSIS — E876 Hypokalemia: Secondary | ICD-10-CM

## 2013-12-13 DIAGNOSIS — I4891 Unspecified atrial fibrillation: Secondary | ICD-10-CM

## 2013-12-13 DIAGNOSIS — H6120 Impacted cerumen, unspecified ear: Secondary | ICD-10-CM | POA: Insufficient documentation

## 2013-12-13 DIAGNOSIS — I509 Heart failure, unspecified: Secondary | ICD-10-CM

## 2013-12-13 DIAGNOSIS — K219 Gastro-esophageal reflux disease without esophagitis: Secondary | ICD-10-CM

## 2013-12-13 DIAGNOSIS — I10 Essential (primary) hypertension: Secondary | ICD-10-CM

## 2013-12-13 LAB — URINALYSIS
Bilirubin Urine: NEGATIVE
Hgb urine dipstick: NEGATIVE
Ketones, ur: NEGATIVE
LEUKOCYTES UA: NEGATIVE
Nitrite: NEGATIVE
Total Protein, Urine: NEGATIVE
UROBILINOGEN UA: 0.2 (ref 0.0–1.0)
Urine Glucose: NEGATIVE
pH: 5.5 (ref 5.0–8.0)

## 2013-12-13 LAB — BASIC METABOLIC PANEL
BUN: 25 mg/dL — ABNORMAL HIGH (ref 6–23)
CO2: 32 meq/L (ref 19–32)
Calcium: 9.7 mg/dL (ref 8.4–10.5)
Chloride: 99 mEq/L (ref 96–112)
Creatinine, Ser: 1 mg/dL (ref 0.4–1.2)
GFR: 55.64 mL/min — AB (ref >60.00)
Glucose, Bld: 66 mg/dL — ABNORMAL LOW (ref 70–99)
Potassium: 4.4 mEq/L (ref 3.5–5.1)
SODIUM: 139 meq/L (ref 135–145)

## 2013-12-13 LAB — CBC WITH DIFFERENTIAL/PLATELET
BASOS ABS: 0 10*3/uL (ref 0.0–0.1)
Basophils Relative: 0.3 % (ref 0.0–3.0)
EOS ABS: 0.3 10*3/uL (ref 0.0–0.7)
Eosinophils Relative: 3.7 % (ref 0.0–5.0)
HCT: 40.4 % (ref 36.0–46.0)
Hemoglobin: 13.4 g/dL (ref 12.0–15.0)
LYMPHS PCT: 15.9 % (ref 12.0–46.0)
Lymphs Abs: 1.2 10*3/uL (ref 0.7–4.0)
MCHC: 33.2 g/dL (ref 30.0–36.0)
MCV: 90.8 fl (ref 78.0–100.0)
MONOS PCT: 8.4 % (ref 3.0–12.0)
Monocytes Absolute: 0.7 10*3/uL (ref 0.1–1.0)
NEUTROS PCT: 71.7 % (ref 43.0–77.0)
Neutro Abs: 5.6 10*3/uL (ref 1.4–7.7)
Platelets: 241 10*3/uL (ref 150.0–400.0)
RBC: 4.45 Mil/uL (ref 3.87–5.11)
RDW: 14.2 % (ref 11.5–15.5)
WBC: 7.8 10*3/uL (ref 4.0–10.5)

## 2013-12-13 LAB — TSH: TSH: 1.7 u[IU]/mL (ref 0.35–4.50)

## 2013-12-13 LAB — LIPID PANEL
CHOL/HDL RATIO: 3
Cholesterol: 185 mg/dL (ref 0–200)
HDL: 63.8 mg/dL (ref >39.00)
LDL CALC: 108 mg/dL — AB (ref 0–99)
Triglycerides: 65 mg/dL (ref 0.0–149.0)
VLDL: 13 mg/dL (ref 0.0–40.0)

## 2013-12-13 LAB — HEPATIC FUNCTION PANEL
ALK PHOS: 86 U/L (ref 39–117)
ALT: 14 U/L (ref 0–35)
AST: 23 U/L (ref 0–37)
Albumin: 3.9 g/dL (ref 3.5–5.2)
BILIRUBIN DIRECT: 0.1 mg/dL (ref 0.0–0.3)
BILIRUBIN TOTAL: 0.5 mg/dL (ref 0.2–1.2)
Total Protein: 6.7 g/dL (ref 6.0–8.3)

## 2013-12-13 MED ORDER — METOPROLOL TARTRATE 100 MG PO TABS: 100.0000 mg | ORAL_TABLET | Freq: Every day | ORAL | Status: AC

## 2013-12-13 MED ORDER — FUROSEMIDE 20 MG PO TABS: 20.0000 mg | ORAL_TABLET | Freq: Every day | ORAL | Status: AC

## 2013-12-13 MED ORDER — CLOPIDOGREL BISULFATE 75 MG PO TABS: 75.0000 mg | ORAL_TABLET | Freq: Every day | ORAL | Status: AC

## 2013-12-13 MED ORDER — ASPIRIN EC 81 MG PO TBEC: 81.0000 mg | DELAYED_RELEASE_TABLET | Freq: Every day | ORAL | Status: AC

## 2013-12-13 MED ORDER — VITAMIN D 1000 UNITS PO TABS: 1000.0000 [IU] | ORAL_TABLET | Freq: Every day | ORAL | Status: AC

## 2013-12-13 MED ORDER — LANSOPRAZOLE 30 MG PO CPDR: 30.0000 mg | DELAYED_RELEASE_CAPSULE | Freq: Every day | ORAL | Status: DC

## 2013-12-13 MED ORDER — TRAMADOL HCL 50 MG PO TABS: 25.0000 mg | ORAL_TABLET | Freq: Two times a day (BID) | ORAL | Status: DC | PRN

## 2013-12-13 MED ORDER — GABAPENTIN 100 MG PO CAPS: ORAL_CAPSULE | ORAL | Status: AC

## 2013-12-13 MED ORDER — NITROGLYCERIN 0.4 MG SL SUBL: 0.4000 mg | SUBLINGUAL_TABLET | SUBLINGUAL | Status: AC | PRN

## 2013-12-13 NOTE — Progress Notes (Signed)
   Subjective:     HPI  The patient presents for a follow-up of  chronic hypertension, CVA, h/o acute CHF w/her last MI in 2000, A fib, aphasia (partial) controlled with medicines. No memory loss    Wt Readings from Last 3 Encounters:  12/13/13 95 lb (43.092 kg)  02/21/13 108 lb 1.9 oz (49.043 kg)  01/31/13 108 lb 12.8 oz (49.351 kg)   BP Readings from Last 3 Encounters:  12/13/13 126/78  02/21/13 110/60  01/31/13 105/70       Review of Systems  Constitutional: Negative for chills, activity change, appetite change, fatigue and unexpected weight change.  HENT: Negative for congestion, ear discharge, ear pain, mouth sores, rhinorrhea and sinus pressure.   Eyes: Negative for visual disturbance.  Respiratory: Negative for cough and chest tightness.   Gastrointestinal: Negative for nausea and abdominal pain.  Genitourinary: Negative for frequency, difficulty urinating and vaginal pain.  Musculoskeletal: Positive for gait problem. Negative for arthralgias and back pain.  Skin: Negative for pallor and rash.  Neurological: Positive for dizziness. Negative for tremors, weakness, light-headedness, numbness and headaches.  Psychiatric/Behavioral: Negative for suicidal ideas, confusion and sleep disturbance. The patient is not nervous/anxious.        Objective:   Physical Exam  Constitutional: She appears well-developed. No distress.  HENT:  Head: Normocephalic.  Right Ear: External ear normal.  Left Ear: External ear normal.  Nose: Nose normal.  Mouth/Throat: Oropharynx is clear and moist. No oropharyngeal exudate.  L wax  Eyes: Conjunctivae are normal. Pupils are equal, round, and reactive to light. Right eye exhibits no discharge. Left eye exhibits no discharge.  Neck: Normal range of motion. Neck supple. No JVD present. No tracheal deviation present. No thyromegaly present.  Cardiovascular: Normal rate and normal heart sounds.   No murmur heard. irreg irreg    Pulmonary/Chest: No stridor. No respiratory distress. She has no wheezes.  Abdominal: Soft. Bowel sounds are normal. She exhibits no distension and no mass. There is no tenderness. There is no rebound and no guarding.  Musculoskeletal: She exhibits no edema and no tenderness.  Lymphadenopathy:    She has no cervical adenopathy.  Neurological: She displays normal reflexes. No cranial nerve deficit. She exhibits normal muscle tone. Coordination abnormal.  Hard hearing, mild aphasia  Skin: No rash noted. No erythema.  Psychiatric: She has a normal mood and affect. Her behavior is normal. Thought content normal.    Lab Results  Component Value Date   WBC 6.7 01/27/2013   HGB 11.8* 01/27/2013   HCT 35.4* 01/27/2013   PLT 240 01/27/2013   GLUCOSE 94 01/30/2013   ALT 16 01/27/2013   AST 21 01/27/2013   NA 138 01/30/2013   K 4.3 01/30/2013   CL 95* 01/30/2013   CREATININE 1.03 01/30/2013   BUN 13 01/30/2013   CO2 35* 01/30/2013   TSH 2.678 01/19/2013   INR 1.10 01/19/2013         Assessment & Plan:

## 2013-12-13 NOTE — Assessment & Plan Note (Signed)
Labs

## 2013-12-13 NOTE — Assessment & Plan Note (Signed)
Continue with current prescription therapy as reflected on the Med list.  

## 2013-12-13 NOTE — Progress Notes (Signed)
Pre visit review using our clinic review tool, if applicable. No additional management support is needed unless otherwise documented below in the visit note. 

## 2013-12-13 NOTE — Assessment & Plan Note (Signed)
Tramadol prn 

## 2013-12-13 NOTE — Assessment & Plan Note (Signed)
Will irrigate 

## 2013-12-14 ENCOUNTER — Telehealth: Payer: Self-pay | Admitting: Internal Medicine

## 2013-12-14 NOTE — Telephone Encounter (Signed)
Relevant patient education mailed to patient.  

## 2013-12-18 ENCOUNTER — Telehealth: Payer: Self-pay | Admitting: *Deleted

## 2013-12-18 NOTE — Telephone Encounter (Signed)
Pt called requesting lab results. Please advise.  

## 2013-12-18 NOTE — Telephone Encounter (Signed)
Spoke with pt advised of MDs message 

## 2013-12-18 NOTE — Telephone Encounter (Signed)
Labs are good. Thx

## 2014-03-07 ENCOUNTER — Other Ambulatory Visit: Payer: Self-pay | Admitting: Internal Medicine

## 2014-04-17 ENCOUNTER — Ambulatory Visit (INDEPENDENT_AMBULATORY_CARE_PROVIDER_SITE_OTHER): Payer: Medicare Other

## 2014-04-17 DIAGNOSIS — Z23 Encounter for immunization: Secondary | ICD-10-CM

## 2014-05-26 ENCOUNTER — Telehealth: Payer: Self-pay | Admitting: *Deleted

## 2014-05-26 NOTE — Telephone Encounter (Signed)
[facility] Triage Call Report Triage Record Num: [id number] Operator: Marshell Garfinkel Patient Name: Lori Travis Call Date & Time: 05/24/2014 11:18:59AM Patient Phone: [phone] PCP: Webb Silversmith Patient Gender: Female PCP Fax : Patient DOB: [birth year] Practice Name: [facility] Reason for Call: Caller: James/Other; PCP: Webb Silversmith; CB#: [phone]; Call regarding Medication Issue; Medication(s): Potassium Orginal prescription was not written by Dr Alain Marion; Office message sent and faxed via infinity. Pt was prescribed K+ last June 2014 following an ED visit. Her son has had her on K+ ever since (the ED MD wrote for 1 years worth). Her last refill on 01/12/14 just ran out. RN checked EPIC/it has never been ordered by Dr. Alain Marion. Pts last K+ level in May 2015 was 4.4. Office note sent for review on Monday when office reopens. Son will call back at the beginning of the week to f/u. Protocol(s) Used: Medication Questions - Adult Recommended Outcome per Protocol: Speak with Provider or Pharmacist within 24 hours Reason for Outcome: Older adult with questions about prescribed and/or nonprescribed medications not covered by available resources Care Advice: ~ 11/

## 2014-06-04 ENCOUNTER — Telehealth: Payer: Self-pay

## 2014-06-04 NOTE — Telephone Encounter (Signed)
Received refill request for Klor-con 13meq--Sig: Take 2 tablets by mouth 2 times daily--last labs were from 11/2013 and Potassium was 4.4--please advise if okay to fill as i do not see where this has ever been filled by you

## 2014-06-04 NOTE — Telephone Encounter (Signed)
We need to find out if she will be see me of Dr. Alain Marion. If she is seeing him, she needs to change him to her PCP

## 2014-06-04 NOTE — Telephone Encounter (Signed)
Pt has a f/u appt with Plotnikov next week--looks like her last appt with Plotnikov was 11/2013-so maybe a 58mth f/u--please advise

## 2014-06-04 NOTE — Telephone Encounter (Signed)
Ok to fill. She should be being seen by me every 6 months. If no appt, please have her make one

## 2014-06-06 NOTE — Telephone Encounter (Signed)
Spoke to pt's daughter and she states pt will be seeing Dr Alain Marion as PCP

## 2014-06-09 ENCOUNTER — Encounter: Payer: Self-pay | Admitting: Internal Medicine

## 2014-06-09 ENCOUNTER — Other Ambulatory Visit (INDEPENDENT_AMBULATORY_CARE_PROVIDER_SITE_OTHER): Payer: Medicare Other

## 2014-06-09 ENCOUNTER — Ambulatory Visit (INDEPENDENT_AMBULATORY_CARE_PROVIDER_SITE_OTHER): Payer: Medicare Other | Admitting: Internal Medicine

## 2014-06-09 VITALS — BP 140/80 | HR 73 | Temp 97.8°F | Wt 98.0 lb

## 2014-06-09 DIAGNOSIS — I482 Chronic atrial fibrillation, unspecified: Secondary | ICD-10-CM

## 2014-06-09 DIAGNOSIS — I10 Essential (primary) hypertension: Secondary | ICD-10-CM

## 2014-06-09 DIAGNOSIS — K219 Gastro-esophageal reflux disease without esophagitis: Secondary | ICD-10-CM

## 2014-06-09 DIAGNOSIS — E876 Hypokalemia: Secondary | ICD-10-CM

## 2014-06-09 DIAGNOSIS — Z23 Encounter for immunization: Secondary | ICD-10-CM

## 2014-06-09 LAB — BASIC METABOLIC PANEL
BUN: 34 mg/dL — ABNORMAL HIGH (ref 6–23)
CO2: 30 mEq/L (ref 19–32)
Calcium: 10 mg/dL (ref 8.4–10.5)
Chloride: 100 mEq/L (ref 96–112)
Creatinine, Ser: 1 mg/dL (ref 0.4–1.2)
GFR: 53.69 mL/min — AB (ref >60.00)
Glucose, Bld: 87 mg/dL (ref 70–99)
POTASSIUM: 5 meq/L (ref 3.5–5.1)
SODIUM: 141 meq/L (ref 135–145)

## 2014-06-09 MED ORDER — PANTOPRAZOLE SODIUM 40 MG PO TBEC: 40.0000 mg | DELAYED_RELEASE_TABLET | Freq: Every day | ORAL | Status: AC

## 2014-06-09 NOTE — Progress Notes (Signed)
Pre visit review using our clinic review tool, if applicable. No additional management support is needed unless otherwise documented below in the visit note. 

## 2014-06-09 NOTE — Assessment & Plan Note (Signed)
Change PPI Rx due to insurance demands - see Rx

## 2014-06-09 NOTE — Assessment & Plan Note (Signed)
Rate controlled 

## 2014-06-09 NOTE — Assessment & Plan Note (Signed)
Labs  Continue with current prescription therapy as reflected on the Med list.  

## 2014-06-09 NOTE — Progress Notes (Signed)
   Subjective:     HPI  The patient presents for a follow-up of  chronic hypertension, CVA, h/o acute CHF w/her last MI in 2000, A fib, aphasia (partial) controlled with medicines. No memory loss per son   Wt Readings from Last 3 Encounters:  06/09/14 98 lb (44.453 kg)  12/13/13 95 lb (43.092 kg)  02/21/13 108 lb 1.9 oz (49.043 kg)   BP Readings from Last 3 Encounters:  06/09/14 140/80  12/13/13 126/78  02/21/13 110/60       Review of Systems  Constitutional: Negative for chills, activity change, appetite change, fatigue and unexpected weight change.  HENT: Negative for congestion, ear discharge, ear pain, mouth sores, rhinorrhea and sinus pressure.   Eyes: Negative for visual disturbance.  Respiratory: Negative for cough and chest tightness.   Gastrointestinal: Negative for nausea and abdominal pain.  Genitourinary: Negative for frequency, difficulty urinating and vaginal pain.  Musculoskeletal: Positive for gait problem. Negative for back pain and arthralgias.  Skin: Negative for pallor and rash.  Neurological: Positive for dizziness. Negative for tremors, weakness, light-headedness, numbness and headaches.  Psychiatric/Behavioral: Negative for suicidal ideas, confusion and sleep disturbance. The patient is not nervous/anxious.        Objective:   Physical Exam  Constitutional: She appears well-developed. No distress.  HENT:  Head: Normocephalic.  Right Ear: External ear normal.  Left Ear: External ear normal.  Nose: Nose normal.  Mouth/Throat: Oropharynx is clear and moist.  Eyes: Conjunctivae are normal. Pupils are equal, round, and reactive to light. Right eye exhibits no discharge. Left eye exhibits no discharge.  Neck: Normal range of motion. Neck supple. No JVD present. No tracheal deviation present. No thyromegaly present.  Cardiovascular: Normal rate, regular rhythm and normal heart sounds.   Pulmonary/Chest: No stridor. No respiratory distress. She has no  wheezes.  Abdominal: Soft. Bowel sounds are normal. She exhibits no distension and no mass. There is no tenderness. There is no rebound and no guarding.  Musculoskeletal: She exhibits no edema or tenderness.  Lymphadenopathy:    She has no cervical adenopathy.  Neurological: She displays normal reflexes. No cranial nerve deficit. She exhibits normal muscle tone. Coordination normal.  Skin: No rash noted. No erythema.  Psychiatric: She has a normal mood and affect. Her behavior is normal.  using a walker at home Mild aphasia  Lab Results  Component Value Date   WBC 7.8 12/13/2013   HGB 13.4 12/13/2013   HCT 40.4 12/13/2013   PLT 241.0 12/13/2013   GLUCOSE 66* 12/13/2013   CHOL 185 12/13/2013   TRIG 65.0 12/13/2013   HDL 63.80 12/13/2013   LDLCALC 108* 12/13/2013   ALT 14 12/13/2013   AST 23 12/13/2013   NA 139 12/13/2013   K 4.4 12/13/2013   CL 99 12/13/2013   CREATININE 1.0 12/13/2013   BUN 25* 12/13/2013   CO2 32 12/13/2013   TSH 1.70 12/13/2013   INR 1.10 01/19/2013         Assessment & Plan:

## 2014-06-09 NOTE — Addendum Note (Signed)
Addended by: Cresenciano Lick on: 06/09/2014 05:03 PM   Modules accepted: Orders

## 2014-06-16 ENCOUNTER — Other Ambulatory Visit: Payer: Self-pay | Admitting: Geriatric Medicine

## 2014-06-26 ENCOUNTER — Telehealth: Payer: Self-pay | Admitting: Internal Medicine

## 2014-06-26 ENCOUNTER — Telehealth: Payer: Self-pay | Admitting: *Deleted

## 2014-06-26 DIAGNOSIS — E875 Hyperkalemia: Secondary | ICD-10-CM

## 2014-06-26 DIAGNOSIS — I10 Essential (primary) hypertension: Secondary | ICD-10-CM

## 2014-06-26 NOTE — Telephone Encounter (Signed)
Potassium is high. No need for KCl. Recheck BMET in 1 mo Thx

## 2014-06-26 NOTE — Telephone Encounter (Signed)
Called pt spoke with daughter-in-law Charlett Nose) md want pt to hold potassium and have potassium check in 1 month. Place order...Johny Chess

## 2014-06-26 NOTE — Telephone Encounter (Signed)
Called pt spoke with daughter-in-law Lori Travis) gave md response...Lori Travis

## 2014-06-26 NOTE — Telephone Encounter (Signed)
Lori Travis / [facility] calling and has called a few times she states ( I am not seeing documentation) pt is out of Klor-Con.

## 2014-07-07 ENCOUNTER — Other Ambulatory Visit: Payer: Self-pay | Admitting: *Deleted

## 2014-07-07 MED ORDER — POTASSIUM CHLORIDE CRYS ER 15 MEQ PO TBCR: 30.0000 meq | EXTENDED_RELEASE_TABLET | Freq: Two times a day (BID) | ORAL | Status: DC

## 2014-07-08 ENCOUNTER — Ambulatory Visit (INDEPENDENT_AMBULATORY_CARE_PROVIDER_SITE_OTHER): Payer: Medicare Other | Admitting: Internal Medicine

## 2014-07-08 ENCOUNTER — Encounter: Payer: Self-pay | Admitting: Internal Medicine

## 2014-07-08 VITALS — BP 132/80 | HR 70 | Temp 97.7°F | Ht 63.0 in | Wt 96.0 lb

## 2014-07-08 DIAGNOSIS — I10 Essential (primary) hypertension: Secondary | ICD-10-CM

## 2014-07-08 DIAGNOSIS — R0781 Pleurodynia: Secondary | ICD-10-CM | POA: Insufficient documentation

## 2014-07-08 NOTE — Patient Instructions (Signed)
Please continue all other medications as before, and refills have been done if requested.  Please have the pharmacy call with any other refills you may need.  Please keep your appointments with your specialists as you may have planned  Please go to the XRAY Department in the Basement (go straight as you get off the elevator) for the x-ray testing tomorrow  You will be contacted by phone if any changes need to be made immediately.  Otherwise, you will receive a letter about your results with an explanation, but please check with MyChart first.  Please remember to sign up for MyChart if you have not done so, as this will be important to you in the future with finding out test results, communicating by private email, and scheduling acute appointments online when needed.

## 2014-07-08 NOTE — Progress Notes (Signed)
Pre visit review using our clinic review tool, if applicable. No additional management support is needed unless otherwise documented below in the visit note. 

## 2014-07-09 ENCOUNTER — Ambulatory Visit (INDEPENDENT_AMBULATORY_CARE_PROVIDER_SITE_OTHER)
Admission: RE | Admit: 2014-07-09 | Discharge: 2014-07-09 | Disposition: A | Discharge status: Home / Self Care | Payer: Medicare Other | Source: Ambulatory Visit | Attending: Internal Medicine | Admitting: Internal Medicine

## 2014-07-09 ENCOUNTER — Other Ambulatory Visit: Payer: Self-pay | Admitting: Internal Medicine

## 2014-07-09 DIAGNOSIS — R0781 Pleurodynia: Secondary | ICD-10-CM

## 2014-07-09 MED ORDER — LEVOFLOXACIN 250 MG PO TABS: 250.0000 mg | ORAL_TABLET | Freq: Every day | ORAL | Status: AC

## 2014-07-13 NOTE — Assessment & Plan Note (Signed)
stable overall by history and exam, recent data reviewed with pt, and pt to continue medical treatment as before,  to f/u any worsening symptoms or concerns BP Readings from Last 3 Encounters:  07/08/14 132/80  06/09/14 140/80  12/13/13 126/78

## 2014-07-13 NOTE — Progress Notes (Signed)
Subjective:    Patient ID: Lori Travis, female    DOB: [birth year], 80 y.o.   MRN: [id number]  HPI  Here with son, unfortunately 2 days ago with loss of balance and slip on floor in the BR standing up from commode use, hit left side of chest on tank rather hard.  Did not feel she needed attention at the time, but pain has persisted and pleuritic, with some sob.  Pt denies other chest pain, wheezing, orthopnea, PND, increased LE swelling, palpitations, dizziness or syncope.  No fever, cough. Past Medical History  Diagnosis Date  . History of chicken pox   . Allergy   . Heart disease   . Hypertension   . Stroke   . Atrial fibrillation   . MI (myocardial infarction)   . Stented coronary artery    No past surgical history on file.  reports that she has quit smoking. She has never used smokeless tobacco. She reports that she does not drink alcohol or use illicit drugs. family history includes Cancer in her mother; Heart disease in her other; Hypertension in her other. There is no history of Stroke. Allergies  Allergen Reactions  . Penicillins     unknown  . Sulfa Antibiotics     unknown   Current Outpatient Prescriptions on File Prior to Visit  Medication Sig Dispense Refill  . aspirin EC 81 MG tablet Take 1 tablet (81 mg total) by mouth daily. 100 tablet 3  . cholecalciferol (VITAMIN D) 1000 UNITS tablet Take 1 tablet (1,000 Units total) by mouth daily. 100 tablet 3  . clopidogrel (PLAVIX) 75 MG tablet Take 1 tablet (75 mg total) by mouth daily. 90 tablet 3  . furosemide (LASIX) 20 MG tablet Take 1 tablet (20 mg total) by mouth daily. 90 tablet 3  . gabapentin (NEURONTIN) 100 MG capsule TAKE 1 CAPSULE (100 MG TOTAL) BY MOUTH AT BEDTIME. 90 capsule 3  . metoprolol (LOPRESSOR) 100 MG tablet Take 1 tablet (100 mg total) by mouth daily. 90 tablet 3  . nitroGLYCERIN (NITROSTAT) 0.4 MG SL tablet Place 1 tablet (0.4 mg total) under the tongue every 5 (five) minutes as needed for chest pain.  20 tablet 3  . pantoprazole (PROTONIX) 40 MG tablet Take 1 tablet (40 mg total) by mouth daily. 30 tablet 11  . traMADol (ULTRAM) 50 MG tablet Take 0.5-1 tablets (25-50 mg total) by mouth every 12 (twelve) hours as needed. 180 tablet 1   No current facility-administered medications on file prior to visit.   Review of Systems  Constitutional: Negative for unusual diaphoresis or other sweats  HENT: Negative for ringing in ear Eyes: Negative for double vision or worsening visual disturbance.  Respiratory: Negative for choking and stridor.   Gastrointestinal: Negative for vomiting or other signifcant bowel change Genitourinary: Negative for hematuria or decreased urine volume.  Musculoskeletal: Negative for other MSK pain or swelling Skin: Negative for color change and worsening wound.  Neurological: Negative for tremors and numbness other than noted  Psychiatric/Behavioral: Negative for decreased concentration or agitation other than above       Objective:   Physical Exam BP 132/80 mmHg  Pulse 70  Temp(Src) 97.7 F (36.5 C) (Oral)  Ht 5\' 3"  (1.6 m)  Wt 96 lb (43.545 kg)  BMI 17.01 kg/m2  SpO2 93% VS noted, non toxic but uncomfortable in pain Constitutional: Pt appears thin but not cachectic  HENT: Head: NCAT.  Right Ear: External ear normal.  Left Ear: External ear  normal.  Eyes: . Pupils are equal, round, and reactive to light. Conjunctivae and EOM are normal Neck: Normal range of motion. Neck supple.  Cardiovascular: Normal rate and regular rhythm.   Pulmonary/Chest: Effort normal and breath sounds without rales or wheezing.  Abd:  Soft, NT, ND, + BS Neurological: Pt is alert. Not confused , motor grossly intact Skin: Skin is warm. + large bruised area left lateral chest area approx t10-12 anterior and mid axillary line Psychiatric: Pt behavior is normal. No agitation.     Assessment & Plan:

## 2014-07-13 NOTE — Assessment & Plan Note (Signed)
?   Fx, for rib xray, cxr, pt declines pan med,  to f/u any worsening symptoms or concerns

## 2014-07-16 ENCOUNTER — Ambulatory Visit (INDEPENDENT_AMBULATORY_CARE_PROVIDER_SITE_OTHER): Payer: Medicare Other | Admitting: Internal Medicine

## 2014-07-16 ENCOUNTER — Other Ambulatory Visit (INDEPENDENT_AMBULATORY_CARE_PROVIDER_SITE_OTHER): Payer: Medicare Other

## 2014-07-16 ENCOUNTER — Encounter: Payer: Self-pay | Admitting: Internal Medicine

## 2014-07-16 VITALS — BP 140/82 | HR 66 | Ht 63.0 in | Wt 96.0 lb

## 2014-07-16 DIAGNOSIS — I10 Essential (primary) hypertension: Secondary | ICD-10-CM

## 2014-07-16 DIAGNOSIS — G309 Alzheimer's disease, unspecified: Secondary | ICD-10-CM

## 2014-07-16 DIAGNOSIS — R32 Unspecified urinary incontinence: Secondary | ICD-10-CM | POA: Insufficient documentation

## 2014-07-16 DIAGNOSIS — F0280 Dementia in other diseases classified elsewhere without behavioral disturbance: Secondary | ICD-10-CM | POA: Insufficient documentation

## 2014-07-16 DIAGNOSIS — R2681 Unsteadiness on feet: Secondary | ICD-10-CM | POA: Insufficient documentation

## 2014-07-16 DIAGNOSIS — Z7189 Other specified counseling: Secondary | ICD-10-CM | POA: Insufficient documentation

## 2014-07-16 LAB — URINALYSIS, ROUTINE W REFLEX MICROSCOPIC
Bilirubin Urine: NEGATIVE
Hgb urine dipstick: NEGATIVE
KETONES UR: NEGATIVE
Nitrite: NEGATIVE
RBC / HPF: NONE SEEN (ref 0–?)
SPECIFIC GRAVITY, URINE: 1.01 (ref 1.000–1.030)
TOTAL PROTEIN, URINE-UPE24: NEGATIVE
URINE GLUCOSE: NEGATIVE
Urobilinogen, UA: 0.2 (ref 0.0–1.0)
pH: 6.5 (ref 5.0–8.0)

## 2014-07-16 MED ORDER — CIPROFLOXACIN HCL 250 MG PO TABS: 250.0000 mg | ORAL_TABLET | Freq: Every day | ORAL | Status: AC

## 2014-07-16 NOTE — Assessment & Plan Note (Signed)
Continue with current prescription therapy as reflected on the Med list.  

## 2014-07-16 NOTE — Progress Notes (Addendum)
Subjective:     HPI  The pt needs to be admitted to [facility] in [place] (Dr Antony Salmon) The patient presents for a follow-up of  chronic hypertension, CVA, h/o acute CHF w/her last MI in 2000, A fib, aphasia (partial) controlled with medicines. C/o memory loss worsening problems per son, wondering, unsteady gait, incontinent.  DNR discussed and confirmed w/pt and son  Wt Readings from Last 3 Encounters:  07/16/14 96 lb (43.545 kg)  07/08/14 96 lb (43.545 kg)  06/09/14 98 lb (44.453 kg)   BP Readings from Last 3 Encounters:  07/16/14 140/82  07/08/14 132/80  06/09/14 140/80   Past Medical History  Diagnosis Date  . History of chicken pox   . Allergy   . Heart disease   . Hypertension   . Stroke   . Atrial fibrillation   . MI (myocardial infarction)   . Stented coronary artery    History reviewed. No pertinent past surgical history.  reports that she has quit smoking. She has never used smokeless tobacco. She reports that she does not drink alcohol or use illicit drugs. family history includes Cancer in her mother; Heart disease in her other; Hypertension in her other. There is no history of Stroke. Allergies  Allergen Reactions  . Penicillins     unknown  . Sulfa Antibiotics     unknown   Current Outpatient Prescriptions on File Prior to Visit  Medication Sig Dispense Refill  . aspirin EC 81 MG tablet Take 1 tablet (81 mg total) by mouth daily. 100 tablet 3  . cholecalciferol (VITAMIN D) 1000 UNITS tablet Take 1 tablet (1,000 Units total) by mouth daily. 100 tablet 3  . clopidogrel (PLAVIX) 75 MG tablet Take 1 tablet (75 mg total) by mouth daily. 90 tablet 3  . furosemide (LASIX) 20 MG tablet Take 1 tablet (20 mg total) by mouth daily. 90 tablet 3  . gabapentin (NEURONTIN) 100 MG capsule TAKE 1 CAPSULE (100 MG TOTAL) BY MOUTH AT BEDTIME. 90 capsule 3  . levofloxacin (LEVAQUIN) 250 MG tablet Take 1 tablet (250 mg total) by mouth daily. 10 tablet 0  . metoprolol  (LOPRESSOR) 100 MG tablet Take 1 tablet (100 mg total) by mouth daily. 90 tablet 3  . nitroGLYCERIN (NITROSTAT) 0.4 MG SL tablet Place 1 tablet (0.4 mg total) under the tongue every 5 (five) minutes as needed for chest pain. 20 tablet 3  . pantoprazole (PROTONIX) 40 MG tablet Take 1 tablet (40 mg total) by mouth daily. 30 tablet 11  . traMADol (ULTRAM) 50 MG tablet Take 0.5-1 tablets (25-50 mg total) by mouth every 12 (twelve) hours as needed. 180 tablet 1   No current facility-administered medications on file prior to visit.      Review of Systems  Constitutional: Negative for chills, activity change, appetite change, fatigue and unexpected weight change.  HENT: Negative for congestion, ear discharge, ear pain, mouth sores, rhinorrhea and sinus pressure.   Eyes: Negative for visual disturbance.  Respiratory: Negative for cough and chest tightness.   Gastrointestinal: Negative for nausea and abdominal pain.  Genitourinary: Negative for frequency, difficulty urinating and vaginal pain.  Musculoskeletal: Positive for gait problem. Negative for back pain and arthralgias.  Skin: Negative for pallor and rash.  Neurological: Positive for dizziness. Negative for tremors, weakness, light-headedness, numbness and headaches.  Psychiatric/Behavioral: Negative for suicidal ideas, confusion and sleep disturbance. The patient is not nervous/anxious.        Objective:   Physical Exam  Constitutional:  She appears well-developed. No distress.  HENT:  Head: Normocephalic.  Right Ear: External ear normal.  Left Ear: External ear normal.  Nose: Nose normal.  Mouth/Throat: Oropharynx is clear and moist.  Eyes: Conjunctivae are normal. Pupils are equal, round, and reactive to light. Right eye exhibits no discharge. Left eye exhibits no discharge.  Neck: Normal range of motion. Neck supple. No JVD present. No tracheal deviation present. No thyromegaly present.  Cardiovascular: Normal rate, regular rhythm  and normal heart sounds.   Pulmonary/Chest: No stridor. No respiratory distress. She has no wheezes.  Abdominal: Soft. Bowel sounds are normal. She exhibits no distension and no mass. There is no tenderness. There is no rebound and no guarding.  Musculoskeletal: She exhibits no edema or tenderness.  Lymphadenopathy:    She has no cervical adenopathy.  Neurological: She displays normal reflexes. No cranial nerve deficit. She exhibits normal muscle tone. Coordination normal.  Skin: No rash noted. No erythema.  Psychiatric: She has a normal mood and affect. Her behavior is normal.  using a walker at home; in a w/c Mild aphasia x2 oriented, alert  Lab Results  Component Value Date   WBC 7.8 12/13/2013   HGB 13.4 12/13/2013   HCT 40.4 12/13/2013   PLT 241.0 12/13/2013   GLUCOSE 87 06/09/2014   CHOL 185 12/13/2013   TRIG 65.0 12/13/2013   HDL 63.80 12/13/2013   LDLCALC 108* 12/13/2013   ALT 14 12/13/2013   AST 23 12/13/2013   NA 141 06/09/2014   K 5.0 06/09/2014   CL 100 06/09/2014   CREATININE 1.0 06/09/2014   BUN 34* 06/09/2014   CO2 30 06/09/2014   TSH 1.70 12/13/2013   INR 1.10 01/19/2013   Forms filled out DNR discussed     Assessment & Plan:

## 2014-07-16 NOTE — Assessment & Plan Note (Signed)
UA

## 2014-07-16 NOTE — Assessment & Plan Note (Addendum)
[facility]  The pt needs to be admitted to [facility] in [place] (Dr Antony Salmon)

## 2014-07-16 NOTE — Assessment & Plan Note (Signed)
DNR discussed and confirmed w/pt and son

## 2014-07-16 NOTE — Assessment & Plan Note (Signed)
The pt needs to be admitted to [facility] in [place] (Dr Antony Salmon) Gilford Rile, w/c

## 2014-07-21 ENCOUNTER — Telehealth: Payer: Self-pay | Admitting: Internal Medicine

## 2014-07-21 NOTE — Telephone Encounter (Signed)
Pt son came by stating he has been trying to get his mother into a nursing home but the facility is requesting a health and physical report from Dr Alain Marion. Please review this request and advise.

## 2014-07-21 NOTE — Telephone Encounter (Signed)
pls print OV 07/16/14 Thx

## 2014-08-19 ENCOUNTER — Other Ambulatory Visit: Payer: Self-pay | Admitting: Internal Medicine

## 2014-08-21 NOTE — Telephone Encounter (Signed)
[employer] [facility] called to check up on this request.

## 2014-08-22 NOTE — Telephone Encounter (Signed)
OK to fill this prescription with additional refills x2 Thank you!  

## 2014-08-22 NOTE — Telephone Encounter (Signed)
Done

## 2014-08-25 ENCOUNTER — Other Ambulatory Visit: Payer: Self-pay | Admitting: Geriatric Medicine

## 2014-11-10 NOTE — Telephone Encounter (Signed)
Encounter closed

## 2015-10-19 IMAGING — CR DG RIBS 2V*L*
3 series · 3 of 3 positions shown · non-contrast
Comparison: None.

CLINICAL DATA: Fall 2 weeks ago.  Rib pain.  Initial evaluation.

EXAM:
LEFT RIBS - 2 VIEW

[view not recorded (1 of 3)]
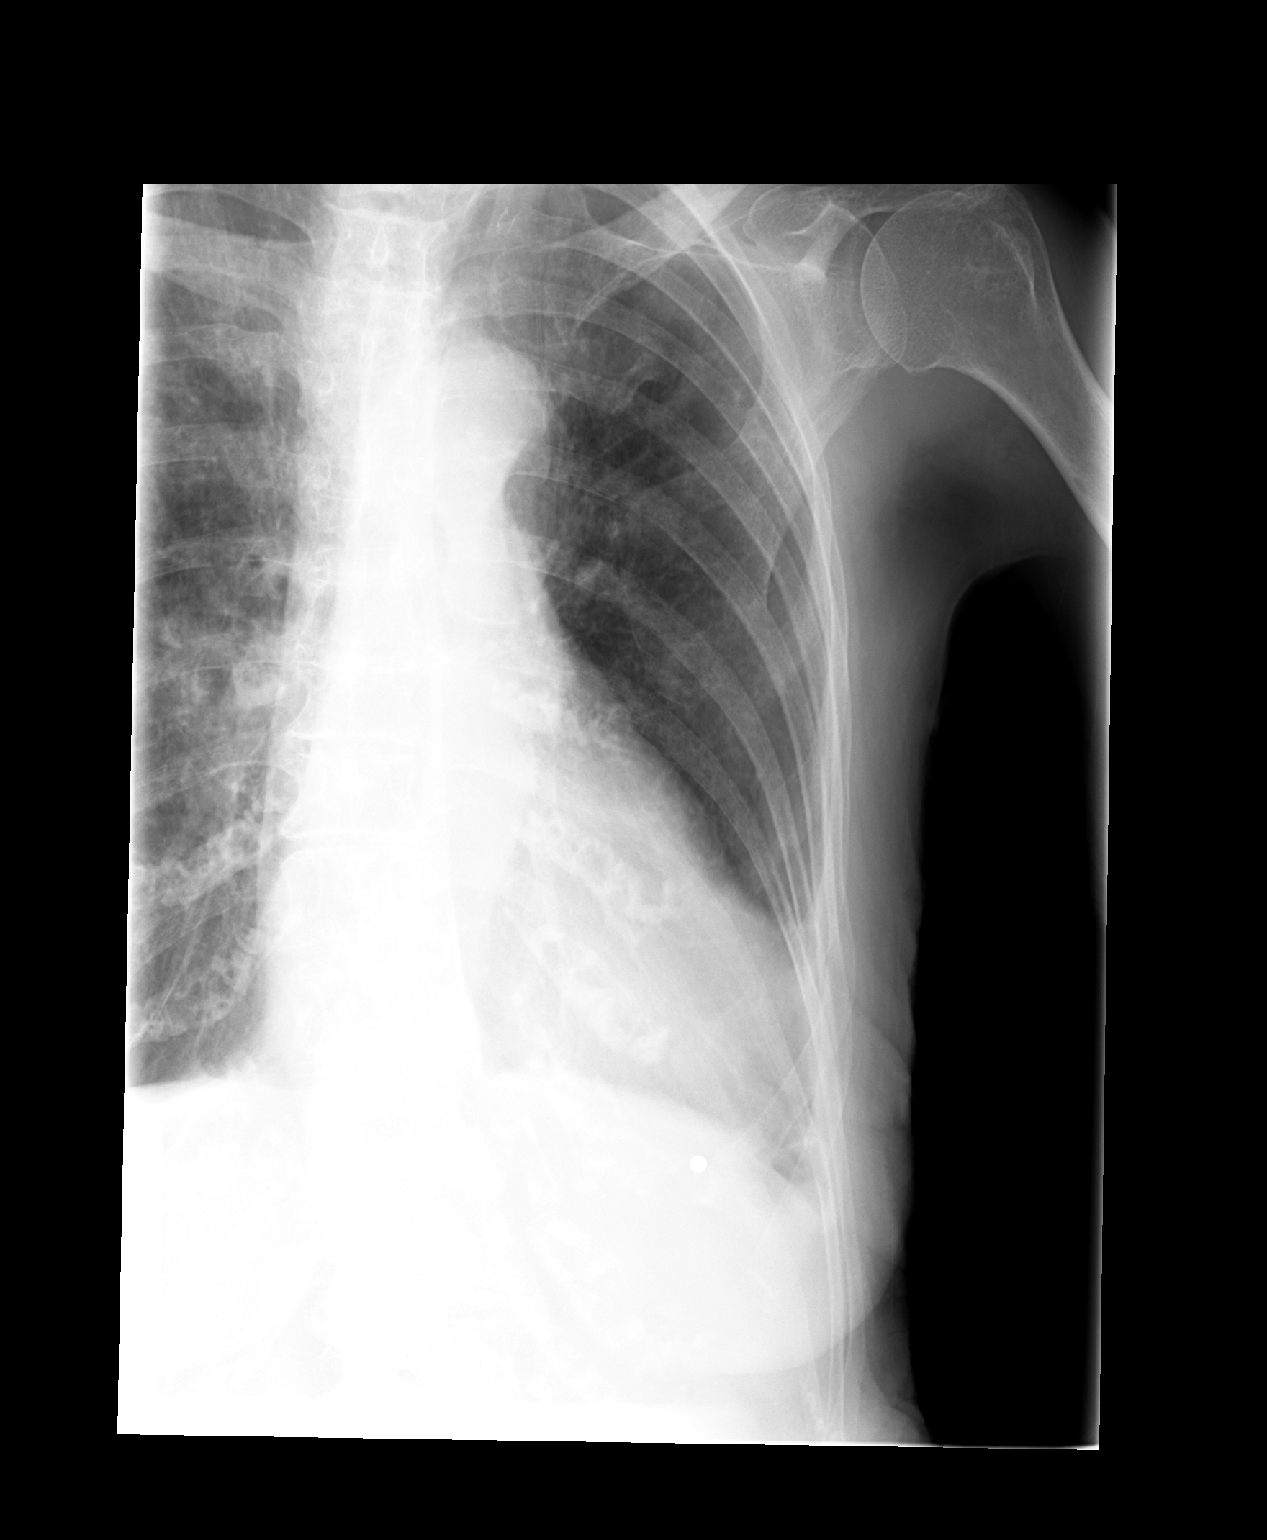

[view not recorded (2 of 3)]
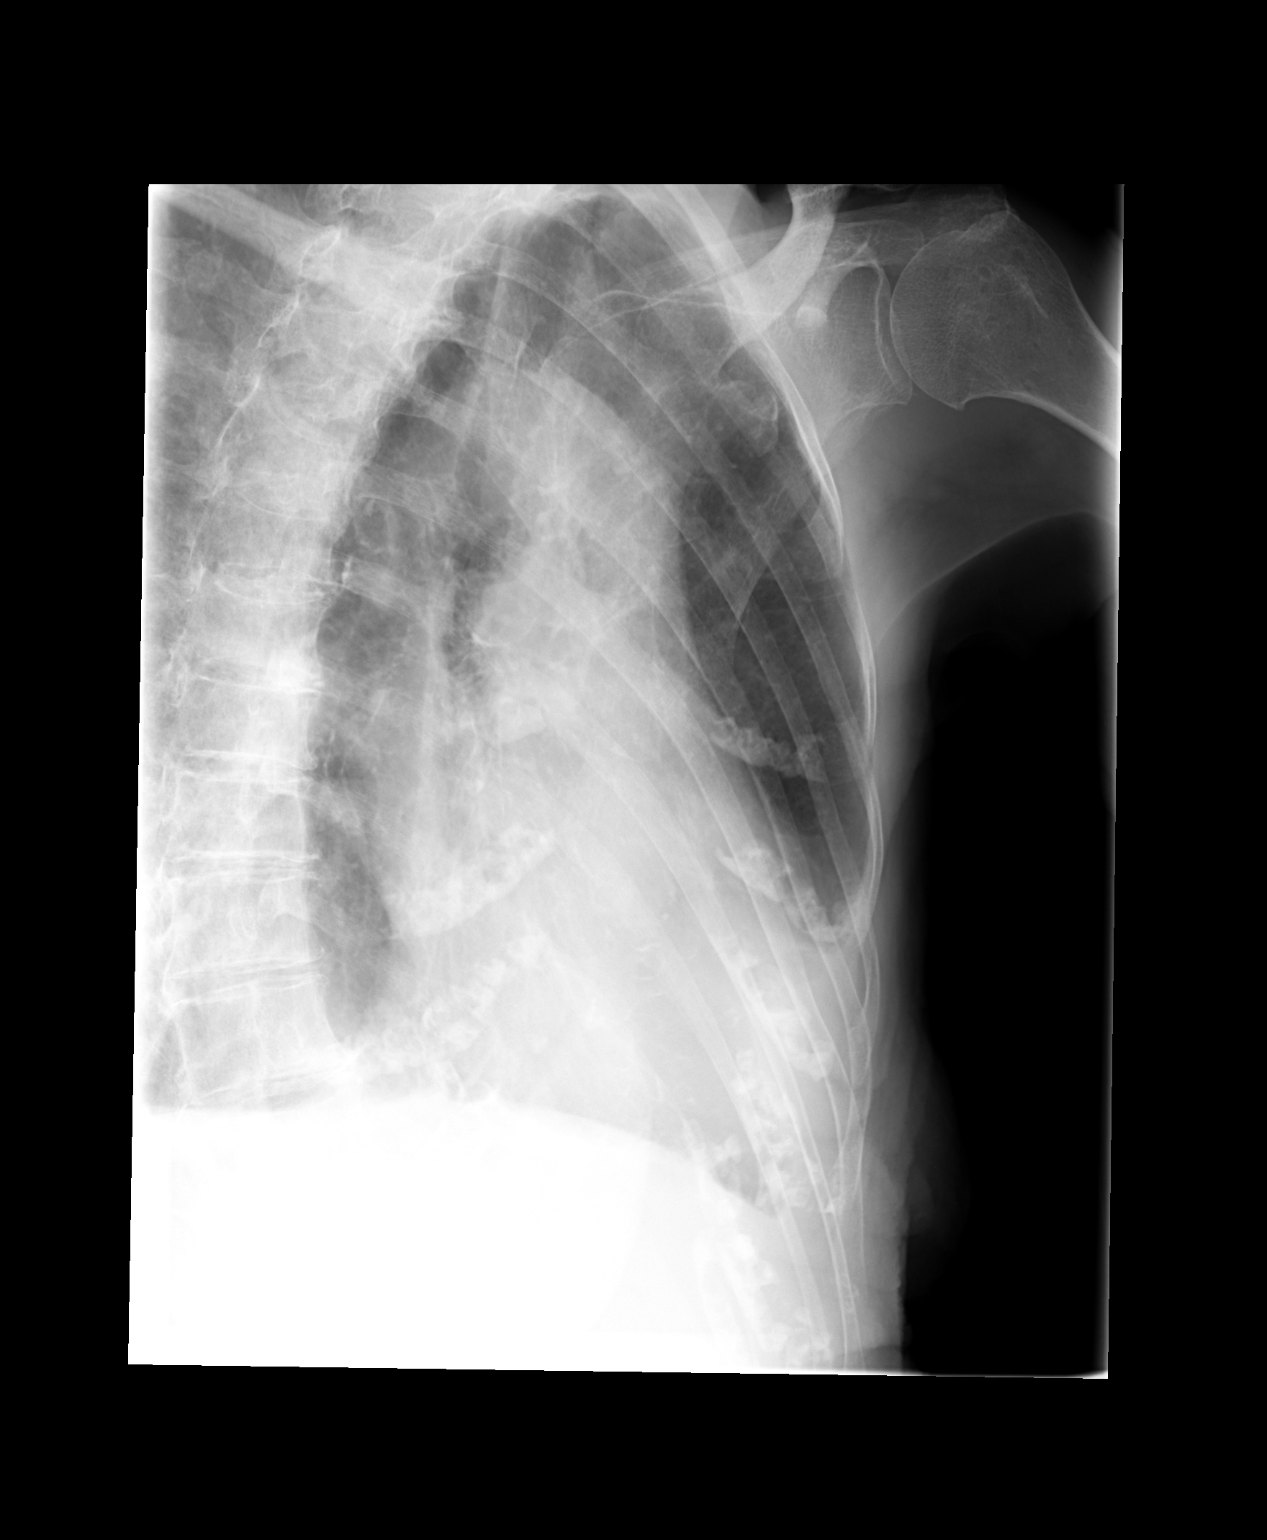

[view not recorded (3 of 3)]
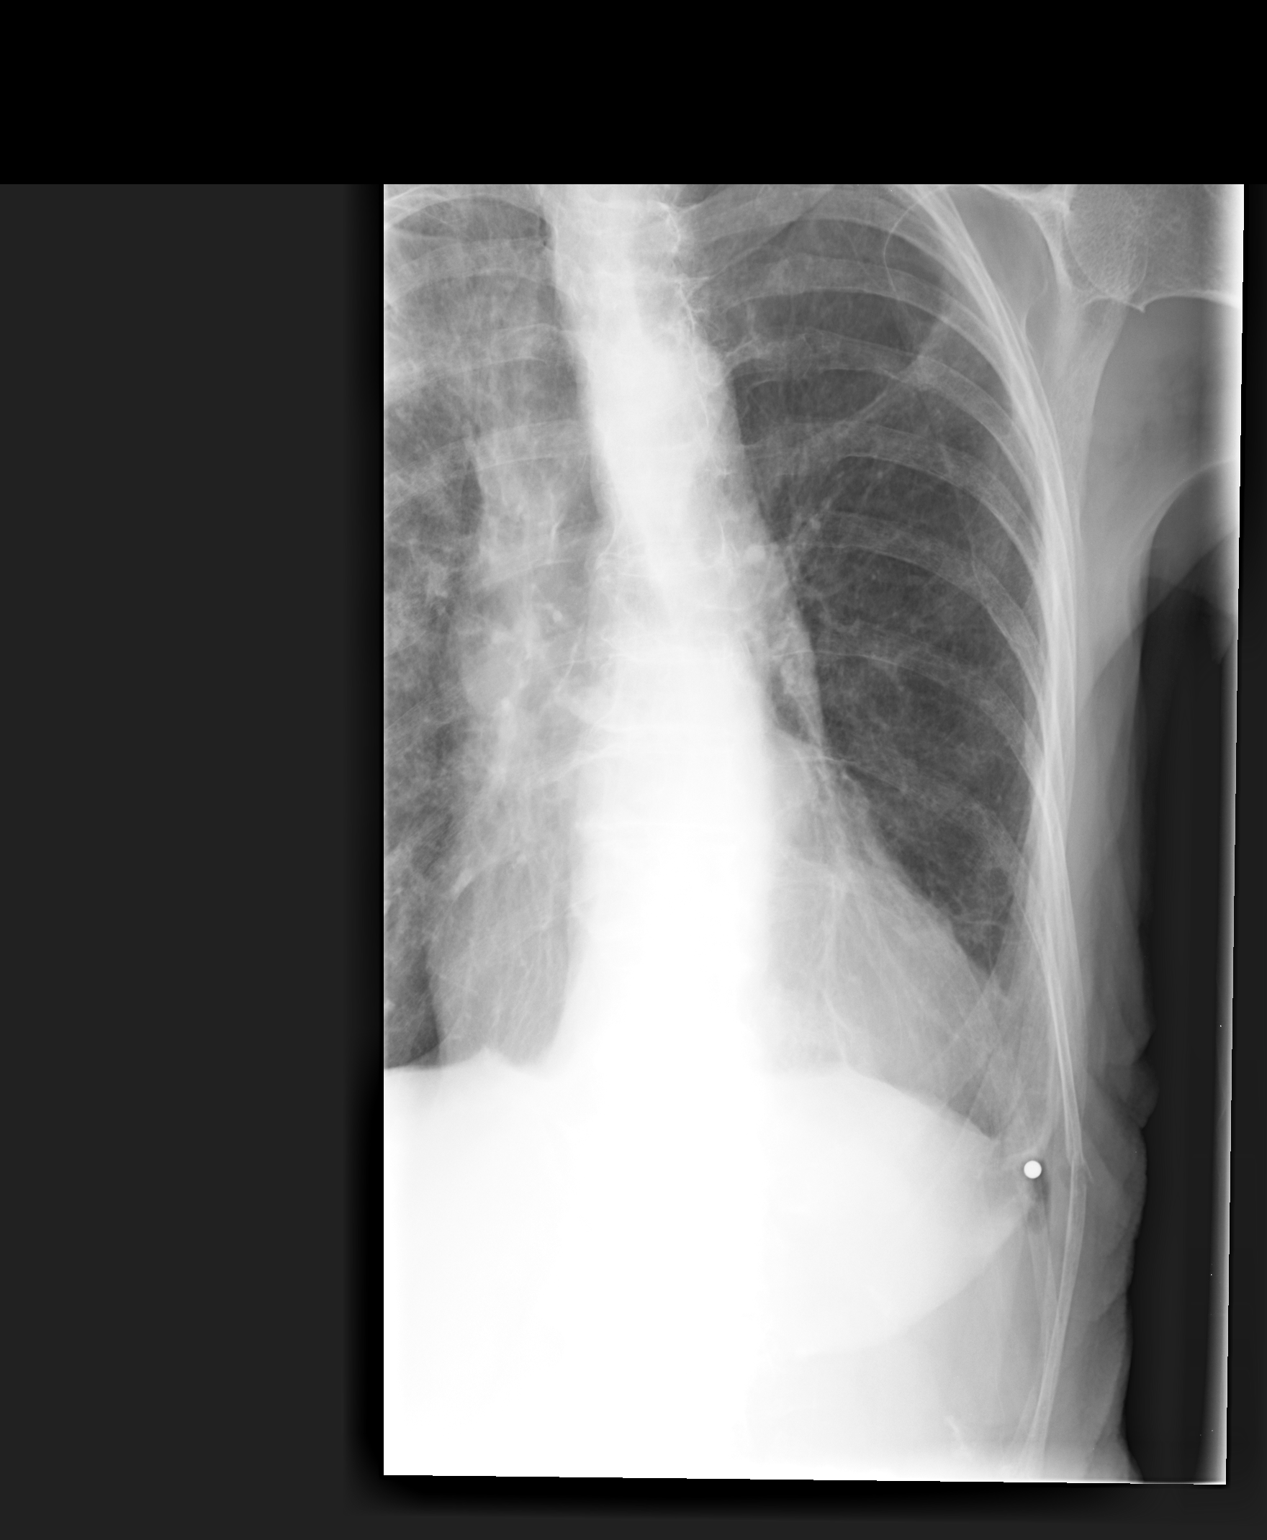

[3 of 3 positions shown; findings below may reference images not displayed]

FINDINGS: Left posterior lateral tenth rib fracture noted. No pneumothorax.
Heart size normal.
IMPRESSION: Left posterior lateral tenth rib nondisplaced fracture. No
pneumothorax.

## 2016-04-17 DEATH — deceased

## 2016-10-12 ENCOUNTER — Other Ambulatory Visit: Payer: Self-pay
